# Patient Record
Sex: Female | Born: 1975 | Race: White | Hispanic: No | Marital: Married | Smoking: Former smoker
Health system: Southern US, Community
[De-identification: ages and names within clinical notes are randomized; demographics above are authoritative.]

## PROBLEM LIST (undated history)

## (undated) DIAGNOSIS — J45909 Unspecified asthma, uncomplicated: Secondary | ICD-10-CM

## (undated) DIAGNOSIS — Z8619 Personal history of other infectious and parasitic diseases: Secondary | ICD-10-CM

## (undated) DIAGNOSIS — R519 Headache, unspecified: Secondary | ICD-10-CM

## (undated) DIAGNOSIS — G43909 Migraine, unspecified, not intractable, without status migrainosus: Secondary | ICD-10-CM

## (undated) DIAGNOSIS — T7840XA Allergy, unspecified, initial encounter: Secondary | ICD-10-CM

## (undated) HISTORY — PX: NASAL SINUS SURGERY: SHX719

## (undated) HISTORY — PX: SKIN CANCER EXCISION: SHX779

## (undated) HISTORY — DX: Unspecified asthma, uncomplicated: J45.909

## (undated) HISTORY — DX: Personal history of other infectious and parasitic diseases: Z86.19

## (undated) HISTORY — DX: Headache, unspecified: R51.9

## (undated) HISTORY — DX: Migraine, unspecified, not intractable, without status migrainosus: G43.909

## (undated) HISTORY — DX: Allergy, unspecified, initial encounter: T78.40XA

---

## 2014-11-28 HISTORY — PX: TUBAL LIGATION: SHX77

## 2015-11-29 DIAGNOSIS — C4490 Unspecified malignant neoplasm of skin, unspecified: Secondary | ICD-10-CM

## 2015-11-29 HISTORY — DX: Unspecified malignant neoplasm of skin, unspecified: C44.90

## 2015-11-29 HISTORY — PX: ABLATION: SHX5711

## 2017-07-24 LAB — THYROID PEROXIDASE ANTIBODIES (TPO) (REFL): Anti-TPO Ab (RDL): <10

## 2018-01-22 LAB — TSH: TSH: 2.35 (ref <=5.90)

## 2018-01-22 LAB — T4, FREE: Free T4: 1.22

## 2018-01-22 LAB — T3, FREE: Free T3: 2.88

## 2018-08-17 LAB — HM MAMMOGRAPHY

## 2019-09-17 ENCOUNTER — Telehealth: Payer: Self-pay | Admitting: Obstetrics and Gynecology

## 2019-09-17 NOTE — Telephone Encounter (Signed)
Patient is schedule for 10/07/19

## 2019-09-17 NOTE — Telephone Encounter (Signed)
Called and spoke with patient to schedule new patient appointment. We have received records from Dr Lilyan Punt office. Patient wishes to call back to schedule annual appointment with Dr. Gilman Schmidt

## 2019-10-07 ENCOUNTER — Ambulatory Visit (INDEPENDENT_AMBULATORY_CARE_PROVIDER_SITE_OTHER): Payer: Managed Care, Other (non HMO) | Admitting: Obstetrics and Gynecology

## 2019-10-07 ENCOUNTER — Other Ambulatory Visit (HOSPITAL_COMMUNITY)
Admission: RE | Admit: 2019-10-07 | Discharge: 2019-10-07 | Disposition: A | Discharge status: Home / Self Care | Payer: Managed Care, Other (non HMO) | Source: Ambulatory Visit | Attending: Obstetrics and Gynecology | Admitting: Obstetrics and Gynecology

## 2019-10-07 ENCOUNTER — Other Ambulatory Visit: Payer: Self-pay

## 2019-10-07 ENCOUNTER — Encounter: Payer: Self-pay | Admitting: Obstetrics and Gynecology

## 2019-10-07 VITALS — BP 110/70 | HR 72 | Ht 67.0 in | Wt 183.0 lb

## 2019-10-07 DIAGNOSIS — Z13220 Encounter for screening for lipoid disorders: Secondary | ICD-10-CM

## 2019-10-07 DIAGNOSIS — E041 Nontoxic single thyroid nodule: Secondary | ICD-10-CM

## 2019-10-07 DIAGNOSIS — N946 Dysmenorrhea, unspecified: Secondary | ICD-10-CM

## 2019-10-07 DIAGNOSIS — Z0000 Encounter for general adult medical examination without abnormal findings: Secondary | ICD-10-CM

## 2019-10-07 DIAGNOSIS — Z130 Encounter for screening for diseases of the blood and blood-forming organs and certain disorders involving the immune mechanism: Secondary | ICD-10-CM

## 2019-10-07 DIAGNOSIS — Z124 Encounter for screening for malignant neoplasm of cervix: Secondary | ICD-10-CM

## 2019-10-07 DIAGNOSIS — Z9889 Other specified postprocedural states: Secondary | ICD-10-CM

## 2019-10-07 DIAGNOSIS — Z9851 Tubal ligation status: Secondary | ICD-10-CM

## 2019-10-07 DIAGNOSIS — Z01419 Encounter for gynecological examination (general) (routine) without abnormal findings: Secondary | ICD-10-CM | POA: Diagnosis not present

## 2019-10-07 DIAGNOSIS — Z1231 Encounter for screening mammogram for malignant neoplasm of breast: Secondary | ICD-10-CM

## 2019-10-07 DIAGNOSIS — Z131 Encounter for screening for diabetes mellitus: Secondary | ICD-10-CM

## 2019-10-07 DIAGNOSIS — Z30011 Encounter for initial prescription of contraceptive pills: Secondary | ICD-10-CM

## 2019-10-07 MED ORDER — NORETHIN ACE-ETH ESTRAD-FE 1-20 MG-MCG PO TABS: 1.0000 | ORAL_TABLET | Freq: Every day | ORAL | 11 refills | Status: DC

## 2019-10-07 MED ORDER — TRAMADOL HCL 50 MG PO TABS: 50.0000 mg | ORAL_TABLET | Freq: Four times a day (QID) | ORAL | 0 refills | Status: DC | PRN

## 2019-10-07 NOTE — Patient Instructions (Signed)
Institute of [place] for Calcium and Vitamin D  Age (yr) Calcium Recommended Dietary Allowance (mg/day) Vitamin D Recommended Dietary Allowance (international units/day)  9-18 1,300 600  19-50 1,000 600  51-70 1,200 600  71 and older 1,200 800  Data from Institute of Medicine. Dietary reference intakes: calcium, vitamin D. [place], [place]: [employer]; 2011.     Budget-Friendly Healthy Eating There are many ways to save money at the grocery store and continue to eat healthy. You can be successful if you:  Plan meals according to your budget.  Make a grocery list and only purchase food according to your grocery list.  Prepare food yourself. What are tips for following this plan?  Reading food labels  Compare food labels between brand name foods and the store brand. Often the nutritional value is the same, but the store brand is lower cost.  Look for products that do not have added sugar, fat, or salt (sodium). These often cost the same but are healthier for you. Products may be labeled as: ? Sugar-free. ? Nonfat. ? Low-fat. ? Sodium-free. ? Low-sodium.  Look for lean ground beef labeled as at least 92% lean and 8% fat. Shopping  Buy only the items on your grocery list and go only to the areas of the store that have the items on your list.  Use coupons only for foods and brands you normally buy. Avoid buying items you wouldn't normally buy simply because they are on sale.  Check online and in newspapers for weekly deals.  Buy healthy items from the bulk bins when available, such as herbs, spices, flour, pasta, nuts, and dried fruit.  Buy fruits and vegetables that are in season. Prices are usually lower on in-season produce.  Look at the unit price on the price tag. Use it to compare different brands and sizes to find out which item is the best deal.  Choose healthy items that are often low-cost, such as carrots,  potatoes, apples, bananas, and oranges. Dried or canned beans are a low-cost protein source.  Buy in bulk and freeze extra food. Items you can buy in bulk include meats, fish, poultry, frozen fruits, and frozen vegetables.  Avoid buying "ready-to-eat" foods, such as pre-cut fruits and vegetables and pre-made salads.  If possible, shop around to discover where you can find the best prices. Consider other retailers such as dollar stores, larger [employer], local fruit and vegetable stands, and farmers markets.  Do not shop when you are hungry. If you shop while hungry, it may be hard to stick to your list and budget.  Resist impulse buying. Use your grocery list as your official plan for the week.  Buy a variety of vegetables and fruits by purchasing fresh, frozen, and canned items.  Look at the top and bottom shelves for deals. Foods at eye level (eye level of an adult or child) are usually more expensive.  Be efficient with your time when shopping. The more time you spend at the store, the more money you are likely to spend.  To save money when choosing more expensive foods like meats and dairy: ? Choose cheaper cuts of meat, such as bone-in chicken thighs and drumsticks instead of skinless and boneless chicken. When you are ready to prepare the chicken, you can remove the skin yourself to make it healthier. ? Choose lean meats like chicken or [place] instead of beef. ? Choose canned seafood, such as tuna, salmon, or sardines. ? Buy  eggs as a low-cost source of protein. ? Buy dried beans and peas, such as lentils, split peas, or kidney beans instead of meats. Dried beans and peas are a good alternative source of protein. ? Buy the larger tubs of yogurt instead of individual-sized containers.  Choose water instead of sodas and other sweetened beverages.  Avoid buying chips, cookies, and other "junk food." These items are usually expensive and not healthy. Cooking  Make extra food  and freeze the extras in meal-sized containers or in individual portions for fast meals and snacks.  Pre-cook on days when you have extra time to prepare meals in advance. You can keep these meals in the fridge or freezer and reheat for a quick meal.  When you come home from the grocery store, wash, peel, and cut fruits and vegetables so they are ready to use and eat. This will help reduce food waste. Meal planning  Do not eat out or get fast food. Prepare food at home.  Make a grocery list and make sure to bring it with you to the store. If you have a smart phone, you could use your phone to create your shopping list.  Plan meals and snacks according to a grocery list and budget you create.  Use leftovers in your meal plan for the week.  Look for recipes where you can cook once and make enough food for two meals.  Include budget-friendly meals like stews, casseroles, and stir-fry dishes.  Try some meatless meals or try "no cook" meals like salads.  Make sure that half your plate is filled with fruits or vegetables. Choose from fresh, frozen, or canned fruits and vegetables. If eating canned, remember to rinse them before eating. This will remove any excess salt added for packaging. Summary  Eating healthy on a budget is possible if you plan your meals according to your budget, purchase according to your budget and grocery list, and prepare food yourself.  Tips for buying more food on a limited budget include buying generic brands, using coupons only for foods you normally buy, and buying healthy items from the bulk bins when available.  Tips for buying cheaper food to replace expensive food include choosing cheaper, lean cuts of meat, and buying dried beans and peas. This information is not intended to replace advice given to you by your health care provider. Make sure you discuss any questions you have with your health care provider. Document Released: 07/18/2014 Document Revised:  11/15/2017 Document Reviewed: 11/15/2017 Elsevier Patient Education  2020 [employer].  Exercising to Stay Healthy To become healthy and stay healthy, it is recommended that you do moderate-intensity and vigorous-intensity exercise. You can tell that you are exercising at a moderate intensity if your heart starts beating faster and you start breathing faster but can still hold a conversation. You can tell that you are exercising at a vigorous intensity if you are breathing much harder and faster and cannot hold a conversation while exercising. Exercising regularly is important. It has many health benefits, such as:  Improving overall fitness, flexibility, and endurance.  Increasing bone density.  Helping with weight control.  Decreasing body fat.  Increasing muscle strength.  Reducing stress and tension.  Improving overall health. How often should I exercise? Choose an activity that you enjoy, and set realistic goals. Your health care provider can help you make an activity plan that works for you. Exercise regularly as told by your health care provider. This may include:  Doing strength training  two times a week, such as: ? Lifting weights. ? Using resistance bands. ? Push-ups. ? Sit-ups. ? Yoga.  Doing a certain intensity of exercise for a given amount of time. Choose from these options: ? A total of 150 minutes of moderate-intensity exercise every week. ? A total of 75 minutes of vigorous-intensity exercise every week. ? A mix of moderate-intensity and vigorous-intensity exercise every week. Children, pregnant women, people who have not exercised regularly, people who are overweight, and older adults may need to talk with a health care provider about what activities are safe to do. If you have a medical condition, be sure to talk with your health care provider before you start a new exercise program. What are some exercise ideas? Moderate-intensity exercise ideas include:   Walking 1 mile (1.6 km) in about 15 minutes.  Biking.  Hiking.  Golfing.  Dancing.  Water aerobics. Vigorous-intensity exercise ideas include:  Walking 4.5 miles (7.2 km) or more in about 1 hour.  Jogging or running 5 miles (8 km) in about 1 hour.  Biking 10 miles (16.1 km) or more in about 1 hour.  Lap swimming.  Roller-skating or in-line skating.  Cross-country skiing.  Vigorous competitive sports, such as football, basketball, and soccer.  Jumping rope.  Aerobic dancing. What are some everyday activities that can help me to get exercise?  [place] work, such as: ? Pushing a Conservation officer, nature. ? Raking and bagging leaves.  Washing your car.  Pushing a stroller.  Shoveling snow.  Gardening.  Washing windows or floors. How can I be more active in my day-to-day activities?  Use stairs instead of an elevator.  Take a walk during your lunch break.  If you drive, park your car farther away from your work or school.  If you take public transportation, get off one stop early and walk the rest of the way.  Stand up or walk around during all of your indoor phone calls.  Get up, stretch, and walk around every 30 minutes throughout the day.  Enjoy exercise with a friend. Support to continue exercising will help you keep a regular routine of activity. What guidelines can I follow while exercising?  Before you start a new exercise program, talk with your health care provider.  Do not exercise so much that you hurt yourself, feel dizzy, or get very short of breath.  Wear comfortable clothes and wear shoes with good support.  Drink plenty of water while you exercise to prevent dehydration or heat stroke.  Work out until your breathing and your heartbeat get faster. Where to find more information  U.S. Department of Health and Human Services: [employer]at  Centers for Disease Control and Prevention (CDC): http://www.wolf.info/ Summary  Exercising regularly is important. It  will improve your overall fitness, flexibility, and endurance.  Regular exercise also will improve your overall health. It can help you control your weight, reduce stress, and improve your bone density.  Do not exercise so much that you hurt yourself, feel dizzy, or get very short of breath.  Before you start a new exercise program, talk with your health care provider. This information is not intended to replace advice given to you by your health care provider. Make sure you discuss any questions you have with your health care provider. Document Released: 12/17/2010 Document Revised: 10/27/2017 Document Reviewed: 10/05/2017 Elsevier Patient Education  2020 [employer].

## 2019-10-07 NOTE — Progress Notes (Signed)
Gynecology Annual Exam  PCP: Patient, No Pcp Per  Chief Complaint:  Chief Complaint  Patient presents with  . Gynecologic Exam    Severe cramping, wants to talk about partial hysterectomy     History of Present Illness: Patient is a 43 y.o. [id number] presents for annual exam. The patient has no complaints today.   LMP: No LMP recorded. Patient has had an ablation. Average Interval: regular, 28 days Duration of flow: 10 days Heavy Menses: no Clots: no Intermenstrual Bleeding: no Postcoital Bleeding: no Dysmenorrhea: yes  Small amount of bleeding but 2-3 days of severe pelvic pain which limits her activities. She take 4 advil every 3-4 hours. Pain usually happen 1-2 days before periods. Her monthly bleeding is much less but lasts for longer 9-10 days of dark brown spotting. Bleeding pattern is improved since the ablation, but is not gone.   She reports that she saw her prior OBGYN for this and was considering a partial hysterectomy.   The patient is sexually active. She currently uses tubal ligation for contraception. She denies dyspareunia.  The patient does perform self breast exams. There is no notable family history of breast or ovarian cancer in her family.  The patient wears seatbelts: yes.   The patient has regular exercise: not asked.    The patient denies current symptoms of depression.    Review of Systems: Review of Systems  Constitutional: Negative for chills, fever, malaise/fatigue and weight loss.  HENT: Negative for congestion, hearing loss and sinus pain.   Eyes: Negative for blurred vision and double vision.  Respiratory: Negative for cough, sputum production, shortness of breath and wheezing.   Cardiovascular: Negative for chest pain, palpitations, orthopnea and leg swelling.  Gastrointestinal: Negative for abdominal pain, constipation, diarrhea, nausea and vomiting.  Genitourinary: Negative for dysuria, flank pain, frequency, hematuria and urgency.   Musculoskeletal: Negative for back pain, falls and joint pain.  Skin: Negative for itching and rash.  Neurological: Negative for dizziness and headaches.  Psychiatric/Behavioral: Negative for depression, substance abuse and suicidal ideas. The patient is not nervous/anxious.    Past Medical History:  Past Medical History:  Diagnosis Date  . Skin cancer     Past Surgical History:  Past Surgical History:  Procedure Laterality Date  . ABLATION  2017  . CESAREAN SECTION  12/07/12,08/11/15  . NASAL SINUS SURGERY    . SKIN CANCER EXCISION     under nose  . TUBAL LIGATION  2016    Gynecologic History:  No LMP recorded. Patient has had an ablation. Contraception: tubal ligation Last Pap: Results were: 2016 NIL  Last mammogram: 2019 Results were: normal per patient  Obstetric History: G2P1103  Family History:  Family History  Problem Relation Age of Onset  . Colon cancer Maternal Uncle 45  . Colon cancer Maternal Uncle 41    Social History:  Social History   Socioeconomic History  . Marital status: Married    Spouse name: Not on file  . Number of children: Not on file  . Years of education: Not on file  . Highest education level: Not on file  Occupational History  . Not on file  Social Needs  . Financial resource strain: Not on file  . Food insecurity    Worry: Not on file    Inability: Not on file  . Transportation needs    Medical: Not on file    Non-medical: Not on file  Tobacco Use  . Smoking status: Never Smoker  .  Smokeless tobacco: Never Used  Substance and Sexual Activity  . Alcohol use: Yes    Comment: occasionally   . Drug use: Never  . Sexual activity: Yes    Birth control/protection: Surgical    Comment: Ablation   Lifestyle  . Physical activity    Days per week: Not on file    Minutes per session: Not on file  . Stress: Not on file  Relationships  . Social Herbalist on phone: Not on file    Gets together: Not on file    Attends  religious service: Not on file    Active member of club or organization: Not on file    Attends meetings of clubs or organizations: Not on file    Relationship status: Not on file  . Intimate partner violence    Fear of current or ex partner: Not on file    Emotionally abused: Not on file    Physically abused: Not on file    Forced sexual activity: Not on file  Other Topics Concern  . Not on file  Social History Narrative  . Not on file    Allergies:  No Known Allergies  Medications: Prior to Admission medications   Medication Sig Start Date End Date Taking? Authorizing Provider  ferrous sulfate 325 (65 FE) MG tablet Take by mouth.   Yes [provider]  levocetirizine (XYZAL) 5 MG tablet Take by mouth.   Yes [provider]  Multiple Vitamin (MULTI-VITAMIN) tablet Take by mouth.   Yes [provider]  phentermine (ADIPEX-P) 37.5 MG tablet TAKE 1 TABLET BY MOUTH EVERY MORNING PRIOR TO BREAKFAST 07/23/19  Yes [provider]  vitamin B-12 (CYANOCOBALAMIN) 100 MCG tablet Take 100 mcg by mouth daily.   Yes [provider]  vitamin E 400 UNIT capsule Take 400 Units by mouth daily.   Yes [provider]    Physical Exam Vitals: Blood pressure 110/70, pulse 72, height 5\' 7"  (1.702 m), weight 183 lb (83 kg).  General: NAD HEENT: normocephalic, anicteric Thyroid: no enlargement, no palpable nodules Pulmonary: No increased work of breathing, CTAB Cardiovascular: RRR, distal pulses 2+ Breast: Breast symmetrical, no tenderness, no palpable nodules or masses, no skin or nipple retraction present, no nipple discharge.  No axillary or supraclavicular lymphadenopathy. Abdomen: NABS, soft, non-tender, non-distended.  Umbilicus without lesions.  No hepatomegaly, splenomegaly or masses palpable. No evidence of hernia  Genitourinary:  External: Normal external female genitalia.  Normal urethral meatus, normal Bartholin's and Skene's glands.     Vagina: Normal vaginal mucosa, no evidence of prolapse.    Cervix: Grossly normal in appearance, no bleeding  Uterus: Non-enlarged, mobile, normal contour.  No CMT  Adnexa: ovaries non-enlarged, no adnexal masses  Rectal: deferred  Lymphatic: no evidence of inguinal lymphadenopathy Extremities: no edema, erythema, or tenderness Neurologic: Grossly intact Psychiatric: mood appropriate, affect full  Female chaperone present for pelvic and breast  portions of the physical exam    Assessment: 43 y.o. G2P1103 routine annual exam  Plan: Problem List Items Addressed This Visit    None    Visit Diagnoses    Healthcare maintenance    -  Primary   Dysmenorrhea       Relevant Medications   norethindrone-ethinyl estradiol (JUNEL FE 1/20) 1-20 MG-MCG tablet   traMADol (ULTRAM) 50 MG tablet   Hx of prior ablation treatment       Hx of tubal ligation       Encounter  for initial prescription of contraceptive pills       Cervical cancer screening       Relevant Orders   Cytology - PAP   Breast cancer screening by mammogram       Relevant Orders   MM 3D SCREEN BREAST BILATERAL   Thyroid nodule       Relevant Orders   TSH   T4, free   Screening cholesterol level       Relevant Orders   Lipid panel   Screening for diabetes mellitus       Relevant Orders   Comprehensive metabolic panel   Screening for deficiency anemia       Relevant Orders   CBC With Differential      1) Mammogram - recommend yearly screening mammogram.  Mammogram Was ordered today  2) STI screening  wasoffered and declined  3) ASCCP guidelines and rational discussed.  Patient opts for every 5 years screening interval  4) Contraception - the patient is currently using  tubal ligation.  She is interested in starting Contraception: extended cycle junel for cyclical pelvic pain  5) Colonoscopy -- Screening recommended starting at age 77 for average risk individuals, age 73 for individuals deemed at increased  risk (including African Americans) and recommended to continue until age 51.  For patient age 40-85 individualized approach is recommended.  Gold standard screening is via colonoscopy, Cologuard screening is an acceptable alternative for patient unwilling or unable to undergo colonoscopy.  "Colorectal cancer screening for average?risk adults: 2018 guideline update from the American Cancer Society"CA: A Cancer Journal for Clinicians: Apr 26, 2017   6) Routine healthcare maintenance including cholesterol, diabetes screening discussed To return fasting at a later date  7) Discussed options of management of dysmenorrhea. Options for IUD, OCP, NSAIDS, expectant management and hysterectomy discussed. She would like to trial 3 months of continuous birth control and then see how she feels. She is currently taking above the recommended amounts of NSAIDS, encouraged her to limit to 800mg  every 8 hours. Will send tramadol which she can also use to limit NSAID use.   8) Return in about 3 months (around 01/07/2020) for GYN visit, lasb asap.  Adrian Prows MD [facility] 10/07/2019 9:58 AM

## 2019-10-09 LAB — CYTOLOGY - PAP
Comment: NEGATIVE
Diagnosis: NEGATIVE
High risk HPV: NEGATIVE

## 2019-10-11 NOTE — Progress Notes (Signed)
Please call patient with normal pap result.

## 2019-10-11 NOTE — Progress Notes (Signed)
Pt aware.

## 2019-10-18 ENCOUNTER — Other Ambulatory Visit: Payer: Self-pay

## 2019-10-18 ENCOUNTER — Other Ambulatory Visit: Payer: Managed Care, Other (non HMO)

## 2019-10-18 DIAGNOSIS — Z130 Encounter for screening for diseases of the blood and blood-forming organs and certain disorders involving the immune mechanism: Secondary | ICD-10-CM

## 2019-10-18 DIAGNOSIS — E041 Nontoxic single thyroid nodule: Secondary | ICD-10-CM

## 2019-10-18 DIAGNOSIS — Z13220 Encounter for screening for lipoid disorders: Secondary | ICD-10-CM

## 2019-10-18 DIAGNOSIS — Z131 Encounter for screening for diabetes mellitus: Secondary | ICD-10-CM

## 2019-10-19 LAB — COMPREHENSIVE METABOLIC PANEL
ALT: 8 IU/L (ref 0–32)
AST: 13 IU/L (ref 0–40)
Albumin/Globulin Ratio: 2.3 — ABNORMAL HIGH (ref 1.2–2.2)
Albumin: 4.5 g/dL (ref 3.8–4.8)
Alkaline Phosphatase: 46 IU/L (ref 39–117)
BUN/Creatinine Ratio: 14 (ref 9–23)
BUN: 12 mg/dL (ref 6–24)
Bilirubin Total: 0.5 mg/dL (ref 0.0–1.2)
CO2: 24 mmol/L (ref 20–29)
Calcium: 9.4 mg/dL (ref 8.7–10.2)
Chloride: 105 mmol/L (ref 96–106)
Creatinine, Ser: 0.88 mg/dL (ref 0.57–1.00)
GFR calc Af Amer: 93 mL/min/{1.73_m2} (ref >59)
GFR calc non Af Amer: 81 mL/min/{1.73_m2} (ref >59)
Globulin, Total: 2 g/dL (ref 1.5–4.5)
Glucose: 89 mg/dL (ref 65–99)
Potassium: 4.6 mmol/L (ref 3.5–5.2)
Sodium: 141 mmol/L (ref 134–144)
Total Protein: 6.5 g/dL (ref 6.0–8.5)

## 2019-10-19 LAB — CBC WITH DIFFERENTIAL
Basophils Absolute: 0.1 10*3/uL (ref 0.0–0.2)
Basos: 1 %
EOS (ABSOLUTE): 0.1 10*3/uL (ref 0.0–0.4)
Eos: 2 %
Hematocrit: 41.8 % (ref 34.0–46.6)
Hemoglobin: 13.9 g/dL (ref 11.1–15.9)
Immature Grans (Abs): 0 10*3/uL (ref 0.0–0.1)
Immature Granulocytes: 0 %
Lymphocytes Absolute: 2.5 10*3/uL (ref 0.7–3.1)
Lymphs: 36 %
MCH: 31.6 pg (ref 26.6–33.0)
MCHC: 33.3 g/dL (ref 31.5–35.7)
MCV: 95 fL (ref 79–97)
Monocytes Absolute: 0.5 10*3/uL (ref 0.1–0.9)
Monocytes: 8 %
Neutrophils Absolute: 3.6 10*3/uL (ref 1.4–7.0)
Neutrophils: 53 %
RBC: 4.4 x10E6/uL (ref 3.77–5.28)
RDW: 12 % (ref 11.7–15.4)
WBC: 6.9 10*3/uL (ref 3.4–10.8)

## 2019-10-19 LAB — LIPID PANEL
Chol/HDL Ratio: 3.8 ratio (ref 0.0–4.4)
Cholesterol, Total: 178 mg/dL (ref 100–199)
HDL: 47 mg/dL (ref >39)
LDL Chol Calc (NIH): 116 mg/dL — ABNORMAL HIGH (ref 0–99)
Triglycerides: 82 mg/dL (ref 0–149)
VLDL Cholesterol Cal: 15 mg/dL (ref 5–40)

## 2019-10-19 LAB — T4, FREE: Free T4: 1.28 ng/dL (ref 0.82–1.77)

## 2019-10-19 LAB — TSH: TSH: 2.67 u[IU]/mL (ref 0.450–4.500)

## 2019-10-21 ENCOUNTER — Other Ambulatory Visit: Payer: Self-pay

## 2019-10-21 ENCOUNTER — Encounter: Payer: Self-pay | Admitting: Family Medicine

## 2019-10-21 ENCOUNTER — Ambulatory Visit: Payer: Managed Care, Other (non HMO) | Admitting: Family Medicine

## 2019-10-21 VITALS — BP 118/82 | HR 77 | Temp 98.0°F | Resp 16 | Ht 67.0 in | Wt 184.1 lb

## 2019-10-21 DIAGNOSIS — G43829 Menstrual migraine, not intractable, without status migrainosus: Secondary | ICD-10-CM | POA: Diagnosis not present

## 2019-10-21 DIAGNOSIS — E041 Nontoxic single thyroid nodule: Secondary | ICD-10-CM

## 2019-10-21 DIAGNOSIS — E663 Overweight: Secondary | ICD-10-CM | POA: Diagnosis not present

## 2019-10-21 DIAGNOSIS — Z8589 Personal history of malignant neoplasm of other organs and systems: Secondary | ICD-10-CM | POA: Insufficient documentation

## 2019-10-21 DIAGNOSIS — G43909 Migraine, unspecified, not intractable, without status migrainosus: Secondary | ICD-10-CM | POA: Insufficient documentation

## 2019-10-21 NOTE — Assessment & Plan Note (Signed)
Will obtain records. Anticipate [place] in 4 years

## 2019-10-21 NOTE — Assessment & Plan Note (Signed)
Working on weight loss and OB/GYN managing medication

## 2019-10-21 NOTE — Assessment & Plan Note (Signed)
Follows with dermatology 

## 2019-10-21 NOTE — Patient Instructions (Signed)

## 2019-10-21 NOTE — Progress Notes (Addendum)
Subjective:     Natalie Robinson is a 43 y.o. female presenting for Establish Care     HPI  #thyroid nodule - stable on last imaging - now every 5 years recommended  Dentist - yes Vision - yes  Overweight - taking phentermine - goal weight 150-160 - tries to do low carb and meal replacement - exercising 3x week at [employer]   #severe menstrual cramps - starting birth control - has tramadol for the cramps - regular cycles - follows with OB/GYN  Review of Systems  Constitutional: Negative.   HENT: Negative.   Eyes: Negative.   Respiratory: Negative.   Cardiovascular: Negative.   Gastrointestinal: Negative.   Endocrine: Negative.   Genitourinary: Negative.   Musculoskeletal: Negative.   Skin: Negative.   Allergic/Immunologic: Negative.   Neurological: Negative.   Hematological: Negative.   Psychiatric/Behavioral: Negative.      Social History   Tobacco Use  Smoking Status Former Smoker  . Packs/day: 0.10  . Years: 10.00  . Pack years: 1.00  . Types: Cigarettes  . Quit date: 11/29/2003  . Years since quitting: 15.9  Smokeless Tobacco Never Used   I have reviewed the patients PMH, PSH, FmHx, Social Hx, medications and allergies and they are updated in Epic.       Objective:    BP Readings from Last 3 Encounters:  10/21/19 118/82  10/07/19 110/70   Wt Readings from Last 3 Encounters:  10/21/19 184 lb 1.6 oz (83.5 kg)  10/07/19 183 lb (83 kg)    BP 118/82   Pulse 77   Temp 98 F (36.7 C)   Resp 16   Ht 5\' 7"  (1.702 m)   Wt 184 lb 1.6 oz (83.5 kg)   SpO2 97%   BMI 28.83 kg/m    Physical Exam Constitutional:      General: She is not in acute distress.    Appearance: She is well-developed. She is not diaphoretic.  HENT:     Right Ear: External ear normal.     Left Ear: External ear normal.     Nose: Nose normal.  Eyes:     Conjunctiva/sclera: Conjunctivae normal.  Neck:     Musculoskeletal: Neck supple.  Cardiovascular:      Rate and Rhythm: Normal rate and regular rhythm.     Heart sounds: No murmur.  Pulmonary:     Effort: Pulmonary effort is normal.     Breath sounds: Normal breath sounds.  Skin:    General: Skin is warm and dry.     Capillary Refill: Capillary refill takes less than 2 seconds.  Neurological:     Mental Status: She is alert. Mental status is at baseline.  Psychiatric:        Mood and Affect: Mood normal.        Behavior: Behavior normal.      The 10-year ASCVD risk score Mikey Bussing DC Jr., et al., 2013) is: 0.6%   Values used to calculate the score:     Age: 16 years     Sex: Female     Is Non-Hispanic African American: No     Diabetic: No     Tobacco smoker: No     Systolic Blood Pressure: 123456 mmHg     Is BP treated: No     HDL Cholesterol: 47 mg/dL     Total Cholesterol: 178 mg/dL      Assessment & Plan:   Problem List Items Addressed This Visit  Cardiovascular and Mediastinum   Migraine    Stable. Will continue to monitor        Endocrine   Thyroid nodule - Primary    Will obtain records. Anticipate [place] in 4 years        Other   History of squamous cell carcinoma    Follows with dermatology      Overweight (BMI 25.0-29.9)    Working on weight loss and OB/GYN managing medication        This visit occurred during the SARS-CoV-2 public health emergency.  Safety protocols were in place, including screening questions prior to the visit, additional usage of staff PPE, and extensive cleaning of exam room while observing appropriate contact time as indicated for disinfecting solutions.     Return in about 1 year (around 10/20/2020).  Lesleigh Noe, MD

## 2019-10-21 NOTE — Assessment & Plan Note (Signed)
Stable. Will continue to monitor.

## 2020-01-06 ENCOUNTER — Ambulatory Visit: Payer: Managed Care, Other (non HMO) | Admitting: Obstetrics and Gynecology

## 2020-01-08 ENCOUNTER — Ambulatory Visit (INDEPENDENT_AMBULATORY_CARE_PROVIDER_SITE_OTHER): Payer: Managed Care, Other (non HMO) | Admitting: Obstetrics and Gynecology

## 2020-01-08 ENCOUNTER — Encounter: Payer: Self-pay | Admitting: Obstetrics and Gynecology

## 2020-01-08 ENCOUNTER — Other Ambulatory Visit: Payer: Self-pay

## 2020-01-15 ENCOUNTER — Ambulatory Visit
Admission: RE | Admit: 2020-01-15 | Discharge: 2020-01-15 | Disposition: A | Discharge status: Home / Self Care | Payer: Managed Care, Other (non HMO) | Source: Ambulatory Visit | Attending: Obstetrics and Gynecology | Admitting: Obstetrics and Gynecology

## 2020-01-15 DIAGNOSIS — Z1231 Encounter for screening mammogram for malignant neoplasm of breast: Secondary | ICD-10-CM | POA: Insufficient documentation

## 2020-01-28 NOTE — Progress Notes (Signed)
Please call with normal mammogram

## 2020-01-29 ENCOUNTER — Encounter: Payer: Self-pay | Admitting: Obstetrics and Gynecology

## 2020-01-29 ENCOUNTER — Ambulatory Visit (INDEPENDENT_AMBULATORY_CARE_PROVIDER_SITE_OTHER): Payer: Managed Care, Other (non HMO) | Admitting: Obstetrics and Gynecology

## 2020-01-29 ENCOUNTER — Other Ambulatory Visit: Payer: Self-pay

## 2020-01-29 VITALS — BP 110/60 | Temp 97.0°F | Ht 67.0 in | Wt 186.0 lb

## 2020-01-29 DIAGNOSIS — N9489 Other specified conditions associated with female genital organs and menstrual cycle: Secondary | ICD-10-CM

## 2020-01-29 DIAGNOSIS — N946 Dysmenorrhea, unspecified: Secondary | ICD-10-CM | POA: Diagnosis not present

## 2020-01-29 DIAGNOSIS — Z9889 Other specified postprocedural states: Secondary | ICD-10-CM

## 2020-01-29 NOTE — Progress Notes (Signed)
Patient ID: Natalie Robinson, female   DOB: 09-Jul-1976, 44 y.o.   MRN: [id number]  Reason for Consult: Follow-up (Plan for what to do with the cramping, havent had much cramping since going off the pill)   Referred by Lesleigh Noe, MD  Subjective:     HPI:  Natalie Robinson is a 44 y.o. female she is following up today after her previous visit in November 2020.  At that time she had been having difficulty with spotting and pain after an endometrial ablation.  She took 3 months of Junel continuous.  She has been off of it for about 4 weeks.  She reports that she did not like how it affected her mood but it did improve her bleeding and cramping.  While taking the pill and since being off of the pill she has not had any uterine bleeding or pain.  She is happy with this result.  She is no longer frequently taking ibuprofen.  She took 1 or 2 tablets of the tramadol before and still has some pills if needed.  She reports that it helped a lot with her pain.    Past Medical History:  Diagnosis Date  . Allergy   . Asthma   . Frequent headaches   . History of chicken pox   . Migraines   . Skin cancer 2017   Family History  Problem Relation Age of Onset  . Colon cancer Maternal Uncle 71  . Colon cancer Maternal Uncle 34  . Hyperlipidemia Mother   . Hyperlipidemia Father   . Arthritis Father   . Cancer Father        skin caner  . Stroke Maternal Grandmother   . Hyperlipidemia Maternal Grandfather   . Stroke Paternal Grandmother   . Mental illness Sister   . Breast cancer Neg Hx    Past Surgical History:  Procedure Laterality Date  . ABLATION  2017  . CESAREAN SECTION  12/07/12,08/11/15  . NASAL SINUS SURGERY    . SKIN CANCER EXCISION     under nose  . TUBAL LIGATION  2016    Short Social History:  Social History   Tobacco Use  . Smoking status: Former Smoker    Packs/day: 0.10    Years: 10.00    Pack years: 1.00    Types: Cigarettes    Quit date: 11/29/2003   Years since quitting: 16.1  . Smokeless tobacco: Never Used  Substance Use Topics  . Alcohol use: Yes    Comment: 1x per week, 1 serving    No Known Allergies  Current Outpatient Medications  Medication Sig Dispense Refill  . BIOTIN PO Take by mouth.    . ferrous sulfate 325 (65 FE) MG tablet Take by mouth.    Marland Kitchen ketoconazole (NIZORAL) 2 % shampoo Apply 1 application topically 2 (two) times a week.    . levocetirizine (XYZAL) 5 MG tablet Take by mouth.    . Multiple Vitamin (MULTI-VITAMIN) tablet Take by mouth.    . traMADol (ULTRAM) 50 MG tablet Take 1 tablet (50 mg total) by mouth every 6 (six) hours as needed for severe pain. 60 tablet 0  . vitamin B-12 (CYANOCOBALAMIN) 100 MCG tablet Take 100 mcg by mouth daily.    Marland Kitchen VITAMIN D PO Take by mouth.    . norethindrone-ethinyl estradiol (JUNEL FE 1/20) 1-20 MG-MCG tablet Take 1 tablet by mouth daily. (Patient not taking: Reported on 10/21/2019) 1 Package 11   No current facility-administered medications for  this visit.    Review of Systems  Constitutional: Negative for chills, fatigue, fever and unexpected weight change.  HENT: Negative for trouble swallowing.  Eyes: Negative for loss of vision.  Respiratory: Negative for cough, shortness of breath and wheezing.  Cardiovascular: Negative for chest pain, leg swelling, palpitations and syncope.  GI: Negative for abdominal pain, blood in stool, diarrhea, nausea and vomiting.  GU: Negative for difficulty urinating, dysuria, frequency and hematuria.  Musculoskeletal: Negative for back pain, leg pain and joint pain.  Skin: Negative for rash.  Neurological: Negative for dizziness, headaches, light-headedness, numbness and seizures.  Psychiatric: Negative for behavioral problem, confusion, depressed mood and sleep disturbance.        Objective:  Objective   Vitals:   01/29/20 0808  BP: 110/60  Temp: (!) 97 F (36.1 C)  Weight: 186 lb (84.4 kg)  Height: 5\' 7"  (1.702 m)   Body  mass index is 29.13 kg/m.  Physical Exam Vitals and nursing note reviewed.  Constitutional:      Appearance: She is well-developed.  HENT:     Head: Normocephalic and atraumatic.  Eyes:     Pupils: Pupils are equal, round, and reactive to light.  Cardiovascular:     Rate and Rhythm: Normal rate and regular rhythm.  Pulmonary:     Effort: Pulmonary effort is normal. No respiratory distress.  Skin:    General: Skin is warm and dry.  Neurological:     Mental Status: She is alert and oriented to person, place, and time.  Psychiatric:        Behavior: Behavior normal.        Thought Content: Thought content normal.        Judgment: Judgment normal.        Assessment/Plan:     44 year old with pain irregular bleeding status post ablation. Symptoms have improved after continuous birth control pills.  Patient is happy with this outcome and will monitor for any return of symptoms.  If necessary she will call for a prescription refill.  She would like to consider a oral contraceptive pill if needed with lower estrogen or different progesterone.   More than 10 minutes were spent face to face with the patient in the room with more than 50% of the time spent providing counseling and discussing the plan of management.     Adrian Prows MD [facility], [facility] 01/29/2020 8:29 AM

## 2020-02-23 ENCOUNTER — Ambulatory Visit: Payer: Managed Care, Other (non HMO) | Attending: Internal Medicine

## 2020-02-23 DIAGNOSIS — Z23 Encounter for immunization: Secondary | ICD-10-CM

## 2020-02-23 NOTE — Progress Notes (Signed)
   Covid-19 Vaccination Clinic  Name:  Natalie Robinson    MRN: [id number] DOB: 1975/12/12  02/23/2020  Ms. Natalie Robinson was observed post Covid-19 immunization for 15 minutes without incident. She was provided with Vaccine Information Sheet and instruction to access the V-Safe system.   Ms. Natalie Robinson was instructed to call 911 with any severe reactions post vaccine: Marland Kitchen Difficulty breathing  . Swelling of face and throat  . A fast heartbeat  . A bad rash all over body  . Dizziness and weakness   Immunizations Administered    Name Date Dose VIS Date Route   Pfizer COVID-19 Vaccine 02/23/2020 12:20 PM 0.3 mL 11/08/2019 Intramuscular   Manufacturer: [place]   Lot: [id number]   [place]: [id number]

## 2020-03-15 ENCOUNTER — Ambulatory Visit: Payer: Managed Care, Other (non HMO) | Attending: Internal Medicine

## 2020-03-15 DIAGNOSIS — Z23 Encounter for immunization: Secondary | ICD-10-CM

## 2020-03-15 NOTE — Progress Notes (Signed)
   Covid-19 Vaccination Clinic  Name:  Natalie Robinson    MRN: [id number] DOB: Mar 27, 1976  03/15/2020  [name] was observed post Covid-19 immunization for 15 minutes without incident. She was provided with Vaccine Information Sheet and instruction to access the V-Safe system.   [name] was instructed to call 911 with any severe reactions post vaccine: Marland Kitchen Difficulty breathing  . Swelling of face and throat  . A fast heartbeat  . A bad rash all over body  . Dizziness and weakness   Immunizations Administered    Name Date Dose VIS Date Route   Pfizer COVID-19 Vaccine 03/15/2020 12:06 PM 0.3 mL 11/08/2019 Intramuscular   Manufacturer: [place]   Lot: [id number]   [place]: [id number]

## 2020-03-26 ENCOUNTER — Ambulatory Visit: Payer: Managed Care, Other (non HMO) | Admitting: Family Medicine

## 2020-03-26 ENCOUNTER — Other Ambulatory Visit: Payer: Self-pay

## 2020-03-26 ENCOUNTER — Encounter: Payer: Self-pay | Admitting: Family Medicine

## 2020-03-26 VITALS — BP 106/78 | HR 92 | Temp 97.8°F | Resp 16 | Ht 67.0 in | Wt 191.0 lb

## 2020-03-26 DIAGNOSIS — R300 Dysuria: Secondary | ICD-10-CM

## 2020-03-26 LAB — POCT URINALYSIS DIPSTICK
Bilirubin, UA: NEGATIVE
Blood, UA: POSITIVE
Glucose, UA: NEGATIVE
Ketones, UA: NEGATIVE
Leukocytes, UA: NEGATIVE
Nitrite, UA: NEGATIVE
Protein, UA: NEGATIVE
Spec Grav, UA: 1.025 (ref 1.010–1.025)
Urobilinogen, UA: 0.2 E.U./dL
pH, UA: 5.5 (ref 5.0–8.0)

## 2020-03-26 MED ORDER — NITROFURANTOIN MONOHYD MACRO 100 MG PO CAPS: 100.0000 mg | ORAL_CAPSULE | Freq: Two times a day (BID) | ORAL | 0 refills | Status: AC

## 2020-03-26 NOTE — Patient Instructions (Signed)
Drink lots of water Can start antibiotics whenever you would like Urine culture should be back by Saturday

## 2020-03-26 NOTE — Progress Notes (Signed)
   Subjective:     Shayma Schwiesow is a 44 y.o. female presenting for Urinary Frequency (started on 03/25/20, urgency, burning with urination, pressure.)     HPI   #Urinary frequency - started yesterday afternoon - frequency - burning at the end - a lot of pressure - incomplete emptying - would try and not be able to pee - hesitancy - no abdominal pain - no n/v, f/c - did have sex recently  - but did not use the bathroom the last few times - no vaginal discharge -    Review of Systems   Social History   Tobacco Use  Smoking Status Former Smoker  . Packs/day: 0.10  . Years: 10.00  . Pack years: 1.00  . Types: Cigarettes  . Quit date: 11/29/2003  . Years since quitting: 16.3  Smokeless Tobacco Never Used        Objective:    BP Readings from Last 3 Encounters:  03/26/20 106/78  01/29/20 110/60  01/08/20 110/60   Wt Readings from Last 3 Encounters:  03/26/20 191 lb (86.6 kg)  01/29/20 186 lb (84.4 kg)  01/08/20 183 lb (83 kg)    BP 106/78   Pulse 92   Temp 97.8 F (36.6 C)   Resp 16   Ht 5\' 7"  (1.702 m)   Wt 191 lb (86.6 kg)   BMI 29.91 kg/m    Physical Exam Constitutional:      General: She is not in acute distress.    Appearance: She is well-developed. She is not diaphoretic.  HENT:     Head: Normocephalic and atraumatic.  Eyes:     Conjunctiva/sclera: Conjunctivae normal.  Cardiovascular:     Rate and Rhythm: Normal rate.  Pulmonary:     Effort: Pulmonary effort is normal.     Breath sounds: Normal breath sounds.  Abdominal:     General: Bowel sounds are normal. There is no distension.     Palpations: Abdomen is soft.     Tenderness: There is no abdominal tenderness. There is no right CVA tenderness, left CVA tenderness or guarding.  Musculoskeletal:     Cervical back: Neck supple.  Skin:    General: Skin is warm and dry.  Neurological:     Mental Status: She is alert.           Assessment & Plan:   Problem List Items  Addressed This Visit    None    Visit Diagnoses    Dysuria    -  Primary   Relevant Medications   nitrofurantoin, macrocrystal-monohydrate, (MACROBID) 100 MG capsule   Other Relevant Orders   POCT urinalysis dipstick (Completed)   Urine Culture     Hx consistent with UTI even though dip with only RBC. Discussed watch and wait, if symptoms resolve with another day of fluid/cranberry juice then no antibiotics. If still having symptoms tomorrow start Abx. Urine culture to verify.   Return if symptoms worsen or fail to improve.  Lesleigh Noe, MD

## 2020-03-28 LAB — URINE CULTURE
MICRO NUMBER:: 10421036
SPECIMEN QUALITY:: ADEQUATE

## 2020-10-07 ENCOUNTER — Encounter: Payer: Self-pay | Admitting: Obstetrics and Gynecology

## 2020-10-07 ENCOUNTER — Other Ambulatory Visit: Payer: Self-pay

## 2020-10-07 ENCOUNTER — Ambulatory Visit (INDEPENDENT_AMBULATORY_CARE_PROVIDER_SITE_OTHER): Payer: 59 | Admitting: Obstetrics and Gynecology

## 2020-10-07 VITALS — BP 128/74 | Ht 67.0 in | Wt 203.6 lb

## 2020-10-07 DIAGNOSIS — Z1329 Encounter for screening for other suspected endocrine disorder: Secondary | ICD-10-CM

## 2020-10-07 DIAGNOSIS — Z13220 Encounter for screening for lipoid disorders: Secondary | ICD-10-CM

## 2020-10-07 DIAGNOSIS — Z23 Encounter for immunization: Secondary | ICD-10-CM

## 2020-10-07 DIAGNOSIS — N946 Dysmenorrhea, unspecified: Secondary | ICD-10-CM

## 2020-10-07 DIAGNOSIS — Z131 Encounter for screening for diabetes mellitus: Secondary | ICD-10-CM

## 2020-10-07 DIAGNOSIS — Z01419 Encounter for gynecological examination (general) (routine) without abnormal findings: Secondary | ICD-10-CM

## 2020-10-07 DIAGNOSIS — Z0000 Encounter for general adult medical examination without abnormal findings: Secondary | ICD-10-CM

## 2020-10-07 DIAGNOSIS — Z130 Encounter for screening for diseases of the blood and blood-forming organs and certain disorders involving the immune mechanism: Secondary | ICD-10-CM

## 2020-10-07 DIAGNOSIS — Z1231 Encounter for screening mammogram for malignant neoplasm of breast: Secondary | ICD-10-CM | POA: Diagnosis not present

## 2020-10-07 DIAGNOSIS — Z1211 Encounter for screening for malignant neoplasm of colon: Secondary | ICD-10-CM

## 2020-10-07 MED ORDER — TRAMADOL HCL 50 MG PO TABS: 50.0000 mg | ORAL_TABLET | Freq: Four times a day (QID) | ORAL | 0 refills | Status: DC | PRN

## 2020-10-07 NOTE — Progress Notes (Signed)
Pt is here for her Annual Exam.

## 2020-10-07 NOTE — Patient Instructions (Addendum)
Institute [employer] Recommended Dietary Allowances for Calcium and Vitamin D  Age (yr) Calcium Recommended Dietary Allowance (mg/day) Vitamin D Recommended Dietary Allowance (international units/day)  9-18 1,300 600  19-50 1,000 600  51-70 1,200 600  71 and older 1,200 800  Data from [employer]. Dietary reference intakes: calcium, vitamin D. [place], [place]: [employer]ies [employer]; 2011.     Exercising to Stay Healthy To become healthy and stay healthy, it is recommended that you do moderate-intensity and vigorous-intensity exercise. You can tell that you are exercising at a moderate intensity if your heart starts beating faster and you start breathing faster but can still hold a conversation. You can tell that you are exercising at a vigorous intensity if you are breathing much harder and faster and cannot hold a conversation while exercising. Exercising regularly is important. It has many health benefits, such as:  Improving overall fitness, flexibility, and endurance.  Increasing bone density.  Helping with weight control.  Decreasing body fat.  Increasing muscle strength.  Reducing stress and tension.  Improving overall health. How often should I exercise? Choose an activity that you enjoy, and set realistic goals. Your health care provider can help you make an activity plan that works for you. Exercise regularly as told by your health care provider. This may include:  Doing strength training two times a week, such as: ? Lifting weights. ? Using resistance bands. ? Push-ups. ? Sit-ups. ? Yoga.  Doing a certain intensity of exercise for a given amount of time. Choose from these options: ? A total of 150 minutes of moderate-intensity exercise every week. ? A total of 75 minutes of vigorous-intensity exercise every week. ? A mix of moderate-intensity and vigorous-intensity exercise every week. Children, pregnant women, people who have not exercised  regularly, people who are overweight, and older adults may need to talk with a health care provider about what activities are safe to do. If you have a medical condition, be sure to talk with your health care provider before you start a new exercise program. What are some exercise ideas? Moderate-intensity exercise ideas include:  Walking 1 mile (1.6 km) in about 15 minutes.  Biking.  Hiking.  Golfing.  Dancing.  Water aerobics. Vigorous-intensity exercise ideas include:  Walking 4.5 miles (7.2 km) or more in about 1 hour.  Jogging or running 5 miles (8 km) in about 1 hour.  Biking 10 miles (16.1 km) or more in about 1 hour.  Lap swimming.  Roller-skating or in-line skating.  Cross-country skiing.  Vigorous competitive sports, such as football, basketball, and soccer.  Jumping rope.  Aerobic dancing. What are some everyday activities that can help me to get exercise?  Yard work, such as: ? Pushing a lawn mower. ? Raking and bagging leaves.  Washing your car.  Pushing a stroller.  Shoveling snow.  Gardening.  Washing windows or floors. How can I be more active in my day-to-day activities?  Use stairs instead of an elevator.  Take a walk during your lunch break.  If you drive, park your car farther away from your work or school.  If you take public transportation, get off one stop early and walk the rest of the way.  Stand up or walk around during all of your indoor phone calls.  Get up, stretch, and walk around every 30 minutes throughout the day.  Enjoy exercise with a friend. Support to continue exercising will help you keep a regular routine of activity. What guidelines can   I follow while exercising?  Before you start a new exercise program, talk with your health care provider.  Do not exercise so much that you hurt yourself, feel dizzy, or get very short of breath.  Wear comfortable clothes and wear shoes with good support.  Drink plenty of  water while you exercise to prevent dehydration or heat stroke.  Work out until your breathing and your heartbeat get faster. Where to find more information  [place] Department of Health and Human Services: www.hhs.gov  Centers for Disease Control and Prevention (CDC): www.cdc.gov Summary  Exercising regularly is important. It will improve your overall fitness, flexibility, and endurance.  Regular exercise also will improve your overall health. It can help you control your weight, reduce stress, and improve your bone density.  Do not exercise so much that you hurt yourself, feel dizzy, or get very short of breath.  Before you start a new exercise program, talk with your health care provider. This information is not intended to replace advice given to you by your health care provider. Make sure you discuss any questions you have with your health care provider. Document Revised: 10/27/2017 Document Reviewed: 10/05/2017 Elsevier Patient Education  2020 [employer]   Budget-Friendly Healthy Eating There are many ways to save money at the grocery store and continue to eat healthy. You can be successful if you:  Plan meals according to your budget.  Make a grocery list and only purchase food according to your grocery list.  Prepare food yourself. What are tips for following this plan?  Reading food labels  Compare food labels between brand name foods and the store brand. Often the nutritional value is the same, but the store brand is lower cost.  Look for products that do not have added sugar, fat, or salt (sodium). These often cost the same but are healthier for you. Products may be labeled as: ? Sugar-free. ? Nonfat. ? Low-fat. ? Sodium-free. ? Low-sodium.  Look for lean ground beef labeled as at least 92% lean and 8% fat. Shopping  Buy only the items on your grocery list and go only to the areas of the store that have the items on your list.  Use coupons only for foods  and brands you normally buy. Avoid buying items you wouldn't normally buy simply because they are on sale.  Check online and in newspapers for weekly deals.  Buy healthy items from the bulk bins when available, such as herbs, spices, flour, pasta, nuts, and dried fruit.  Buy fruits and vegetables that are in season. Prices are usually lower on in-season produce.  Look at the unit price on the price tag. Use it to compare different brands and sizes to find out which item is the best deal.  Choose healthy items that are often low-cost, such as carrots, potatoes, apples, bananas, and oranges. Dried or canned beans are a low-cost protein source.  Buy in bulk and freeze extra food. Items you can buy in bulk include meats, fish, poultry, frozen fruits, and frozen vegetables.  Avoid buying "ready-to-eat" foods, such as pre-cut fruits and vegetables and pre-made salads.  If possible, shop around to discover where you can find the best prices. Consider other retailers such as dollar stores, larger wholesale stores, local fruit and vegetable stands, and farmers markets.  Do not shop when you are hungry. If you shop while hungry, it may be hard to stick to your list and budget.  Resist impulse buying. Use your grocery list as   your official plan for the week.  Buy a variety of vegetables and fruits by purchasing fresh, frozen, and canned items.  Look at the top and bottom shelves for deals. Foods at eye level (eye level of an adult or child) are usually more expensive.  Be efficient with your time when shopping. The more time you spend at the store, the more money you are likely to spend.  To save money when choosing more expensive foods like meats and dairy: ? Choose cheaper cuts of meat, such as bone-in chicken thighs and drumsticks instead of skinless and boneless chicken. When you are ready to prepare the chicken, you can remove the skin yourself to make it healthier. ? Choose lean meats like  chicken or turkey instead of beef. ? Choose canned seafood, such as tuna, salmon, or sardines. ? Buy eggs as a low-cost source of protein. ? Buy dried beans and peas, such as lentils, split peas, or kidney beans instead of meats. Dried beans and peas are a good alternative source of protein. ? Buy the larger tubs of yogurt instead of individual-sized containers.  Choose water instead of sodas and other sweetened beverages.  Avoid buying chips, cookies, and other "junk food." These items are usually expensive and not healthy. Cooking  Make extra food and freeze the extras in meal-sized containers or in individual portions for fast meals and snacks.  Pre-cook on days when you have extra time to prepare meals in advance. You can keep these meals in the fridge or freezer and reheat for a quick meal.  When you come home from the grocery store, wash, peel, and cut fruits and vegetables so they are ready to use and eat. This will help reduce food waste. Meal planning  Do not eat out or get fast food. Prepare food at home.  Make a grocery list and make sure to bring it with you to the store. If you have a smart phone, you could use your phone to create your shopping list.  Plan meals and snacks according to a grocery list and budget you create.  Use leftovers in your meal plan for the week.  Look for recipes where you can cook once and make enough food for two meals.  Include budget-friendly meals like stews, casseroles, and stir-fry dishes.  Try some meatless meals or try "no cook" meals like salads.  Make sure that half your plate is filled with fruits or vegetables. Choose from fresh, frozen, or canned fruits and vegetables. If eating canned, remember to rinse them before eating. This will remove any excess salt added for packaging. Summary  Eating healthy on a budget is possible if you plan your meals according to your budget, purchase according to your budget and grocery list, and  prepare food yourself.  Tips for buying more food on a limited budget include buying generic brands, using coupons only for foods you normally buy, and buying healthy items from the bulk bins when available.  Tips for buying cheaper food to replace expensive food include choosing cheaper, lean cuts of meat, and buying dried beans and peas. This information is not intended to replace advice given to you by your health care provider. Make sure you discuss any questions you have with your health care provider. Document Revised: 11/15/2017 Document Reviewed: 11/15/2017 Elsevier Patient Education  2020 [employer].   Bone Health Bones protect organs, store calcium, anchor muscles, and support the whole body. Keeping your bones strong is important, especially as you   get older. You can take actions to help keep your bones strong and healthy. Why is keeping my bones healthy important?  Keeping your bones healthy is important because your body constantly replaces bone cells. Cells get old, and new cells take their place. As we age, we lose bone cells because the body may not be able to make enough new cells to replace the old cells. The amount of bone cells and bone tissue you have is referred to as bone mass. The higher your bone mass, the stronger your bones. The aging process leads to an overall loss of bone mass in the body, which can increase the likelihood of:  Joint pain and stiffness.  Broken bones.  A condition in which the bones become weak and brittle (osteoporosis). A large decline in bone mass occurs in older adults. In women, it occurs about the time of menopause. What actions can I take to keep my bones healthy? Good health habits are important for maintaining healthy bones. This includes eating nutritious foods and exercising regularly. To have healthy bones, you need to get enough of the right minerals and vitamins. Most nutrition experts recommend getting these nutrients from the  foods that you eat. In some cases, taking supplements may also be recommended. Doing certain types of exercise is also important for bone health. What are the nutritional recommendations for healthy bones?  Eating a well-balanced diet with plenty of calcium and vitamin D will help to protect your bones. Nutritional recommendations vary from person to person. Ask your health care provider what is healthy for you. Here are some general guidelines. Get enough calcium Calcium is the most important (essential) mineral for bone health. Most people can get enough calcium from their diet, but supplements may be recommended for people who are at risk for osteoporosis. Good sources of calcium include:  Dairy products, such as low-fat or nonfat milk, cheese, and yogurt.  Dark green leafy vegetables, such as bok choy and broccoli.  Calcium-fortified foods, such as orange juice, cereal, bread, soy beverages, and tofu products.  Nuts, such as almonds. Follow these recommended amounts for daily calcium intake:  Children, age 1-3: 700 mg.  Children, age 4-8: 1,000 mg.  Children, age 9-13: 1,300 mg.  Teens, age 14-18: 1,300 mg.  Adults, age 19-50: 1,000 mg.  Adults, age 51-70: ? Men: 1,000 mg. ? Women: 1,200 mg.  Adults, age 71 or older: 1,200 mg.  Pregnant and breastfeeding females: ? Teens: 1,300 mg. ? Adults: 1,000 mg. Get enough vitamin D Vitamin D is the most essential vitamin for bone health. It helps the body absorb calcium. Sunlight stimulates the skin to make vitamin D, so be sure to get enough sunlight. If you live in a cold climate or you do not get outside often, your health care provider may recommend that you take vitamin D supplements. Good sources of vitamin D in your diet include:  Egg yolks.  Saltwater fish.  Milk and cereal fortified with vitamin D. Follow these recommended amounts for daily vitamin D intake:  Children and teens, age 1-18: 600 international  units.  Adults, age 50 or younger: 400-800 international units.  Adults, age 51 or older: 800-1,000 international units. Get other important nutrients Other nutrients that are important for bone health include:  Phosphorus. This mineral is found in meat, poultry, dairy foods, nuts, and legumes. The recommended daily intake for adult men and adult women is 700 mg.  Magnesium. This mineral is found in seeds, nuts, dark   green vegetables, and legumes. The recommended daily intake for adult men is 400-420 mg. For adult women, it is 310-320 mg.  Vitamin K. This vitamin is found in green leafy vegetables. The recommended daily intake is 120 mg for adult men and 90 mg for adult women. What type of physical activity is best for building and maintaining healthy bones? Weight-bearing and strength-building activities are important for building and maintaining healthy bones. Weight-bearing activities cause muscles and bones to work against gravity. Strength-building activities increase the strength of the muscles that support bones. Weight-bearing and muscle-building activities include:  Walking and hiking.  Jogging and running.  Dancing.  Gym exercises.  Lifting weights.  Tennis and racquetball.  Climbing stairs.  Aerobics. Adults should get at least 30 minutes of moderate physical activity on most days. Children should get at least 60 minutes of moderate physical activity on most days. Ask your health care provider what type of exercise is best for you. How can I find out if my bone mass is low? Bone mass can be measured with an X-ray test called a bone mineral density (BMD) test. This test is recommended for all women who are age 65 or older. It may also be recommended for:  Men who are age 70 or older.  People who are at risk for osteoporosis because of: ? Having bones that break easily. ? Having a long-term disease that weakens bones, such as kidney disease or rheumatoid  arthritis. ? Having menopause earlier than normal. ? Taking medicine that weakens bones, such as steroids, thyroid hormones, or hormone treatment for breast cancer or prostate cancer. ? Smoking. ? Drinking three or more alcoholic drinks a day. If you find that you have a low bone mass, you may be able to prevent osteoporosis or further bone loss by changing your diet and lifestyle. Where can I find more information? For more information, check out the following websites:  National Osteoporosis Foundation: www.nof.org/patients  National Institutes of Health: www.bones.nih.gov  International Osteoporosis Foundation: www.iofbonehealth.org Summary  The aging process leads to an overall loss of bone mass in the body, which can increase the likelihood of broken bones and osteoporosis.  Eating a well-balanced diet with plenty of calcium and vitamin D will help to protect your bones.  Weight-bearing and strength-building activities are also important for building and maintaining strong bones.  Bone mass can be measured with an X-ray test called a bone mineral density (BMD) test. This information is not intended to replace advice given to you by your health care provider. Make sure you discuss any questions you have with your health care provider. Document Revised: 12/11/2017 Document Reviewed: 12/11/2017 Elsevier Patient Education  2020 [employer].   

## 2020-10-07 NOTE — Progress Notes (Signed)
Gynecology Annual Exam  PCP: Lesleigh Noe, MD  Chief Complaint:  Chief Complaint  Patient presents with  . Gynecologic Exam    History of Present Illness: Patient is a 44 y.o. [id number] presents for annual exam. The patient has no complaints today.   LMP: No LMP recorded. Patient has had an ablation.   Patient reports that she has been feeling well.  She reports that she has continued to have some pain during the type of time of her monthly menstrual cycle.  She has been using tramadol occasionally as needed during these days.  She reports that some months she has no pain and does not need to use tramadol and then other months she will use the tramadol.  Sometimes the tramadol helps relieve her symptoms of menstrual pain and other times the tramadol does not work.  She still has pain which shoots down her legs especially on the right.  She is happy with how she is managing this pain and desires to continue with this plan.  She is not interested in considering hysterectomy at this time.  The patient is sexually active. She denies dyspareunia.  The patient does perform self breast exams.  There is no notable family history of breast or ovarian cancer in her family.  The patient has regular exercise: sometimes, takes Burn classes, desires to continue to exercise more.    The patient denies current symptoms of depression.    Review of Systems: Review of Systems  Constitutional: Negative for chills, fever, malaise/fatigue and weight loss.  HENT: Negative for congestion, hearing loss and sinus pain.   Eyes: Negative for blurred vision and double vision.  Respiratory: Negative for cough, sputum production, shortness of breath and wheezing.   Cardiovascular: Negative for chest pain, palpitations, orthopnea and leg swelling.  Gastrointestinal: Negative for abdominal pain, constipation, diarrhea, nausea and vomiting.  Genitourinary: Negative for dysuria, flank pain, frequency, hematuria and  urgency.  Musculoskeletal: Negative for back pain, falls and joint pain.  Skin: Negative for itching and rash.  Neurological: Negative for dizziness and headaches.  Psychiatric/Behavioral: Negative for depression, substance abuse and suicidal ideas. The patient is not nervous/anxious.     Past Medical History:  Patient Active Problem List   Diagnosis Date Noted  . Thyroid nodule 10/21/2019    2014 had a nuclear screen that was normal. Had been doing annual [place], but now to every 5 years. Per imaging 2019 - [place] head/neck - 5 mm nodule - recommend following if >1 cm.    . History of squamous cell carcinoma 10/21/2019  . Migraine 10/21/2019    Associated with cycle. Managed with Advil primarily   . Overweight (BMI 25.0-29.9) 10/21/2019    Past Surgical History:  Past Surgical History:  Procedure Laterality Date  . ABLATION  2017  . CESAREAN SECTION  12/07/12,08/11/15  . NASAL SINUS SURGERY    . SKIN CANCER EXCISION     under nose  . TUBAL LIGATION  2016    Gynecologic History:  No LMP recorded. Patient has had an ablation. Last Pap: Results were: NIL  Last mammogram: 2021 Results were: BI-RAD I  Obstetric History: [id number]  Family History:  Family History  Problem Relation Age of Onset  . Colon cancer Maternal Uncle 33  . Colon cancer Maternal Uncle 19  . Hyperlipidemia Mother   . Hyperlipidemia Father   . Arthritis Father   . Cancer Father        skin caner  .  Stroke Maternal Grandmother   . Hyperlipidemia Maternal Grandfather   . Stroke Paternal Grandmother   . Mental illness Sister   . Breast cancer Neg Hx     Social History:  Social History   Socioeconomic History  . Marital status: Married    Spouse name: Elta Guadeloupe  . Number of children: 3  . Years of education: College  . Highest education level: Not on file  Occupational History  . Not on file  Tobacco Use  . Smoking status: Former Smoker    Packs/day: 0.10    Years: 10.00    Pack years: 1.00    Types:  Cigarettes    Quit date: 11/29/2003    Years since quitting: 16.8  . Smokeless tobacco: Never Used  Vaping Use  . Vaping Use: Never used  Substance and Sexual Activity  . Alcohol use: Yes    Comment: 1x per week, 1 serving  . Drug use: Never  . Sexual activity: Yes    Birth control/protection: Surgical    Comment: Ablation   Other Topics Concern  . Not on file  Social History Narrative   10/21/19   From: [place]: with Husband Elta Guadeloupe), and 3 children   Work: has a Systems developer      Family: twins - Aiden and Sport and exercise psychologist (2014) and Thomasena Edis (2015)      Enjoys: spend time with family, reading       Exercise: burn boot camp - 3 times a week   Diet: trying to manage calories - low carb - bar for breakfast/shake for lunch, regular meal for dinner      Safety   Seat belts: Yes    Guns: Yes  and secure   Safe in relationships: Yes    Social Determinants of Health   Financial Resource Strain: Low Risk   . Difficulty of Paying Living Expenses: Not hard at all  Food Insecurity:   . Worried About Charity fundraiser in the Last Year: Not on file  . Ran Out of Food in the Last Year: Not on file  Transportation Needs:   . Lack of Transportation (Medical): Not on file  . Lack of Transportation (Non-Medical): Not on file  Physical Activity:   . Days of Exercise per Week: Not on file  . Minutes of Exercise per Session: Not on file  Stress:   . Feeling of Stress : Not on file  Social Connections:   . Frequency of Communication with Friends and Family: Not on file  . Frequency of Social Gatherings with Friends and Family: Not on file  . Attends Religious Services: Not on file  . Active Member of Clubs or Organizations: Not on file  . Attends Archivist Meetings: Not on file  . Marital Status: Not on file  Intimate Partner Violence:   . Fear of Current or Ex-Partner: Not on file  . Emotionally Abused: Not on file  . Physically Abused: Not on file  .  Sexually Abused: Not on file    Allergies:  No Known Allergies  Medications: Prior to Admission medications   Medication Sig Start Date End Date Taking? Authorizing Provider  BIOTIN PO Take by mouth.    [provider]  ferrous sulfate 325 (65 FE) MG tablet Take by mouth.    [provider]  ketoconazole (NIZORAL) 2 % shampoo Apply 1 application topically 2 (two) times a week. 01/27/20   [provider]  levocetirizine (  XYZAL) 5 MG tablet Take by mouth.    [provider]  Multiple Vitamin (MULTI-VITAMIN) tablet Take by mouth.    [provider]  phentermine (ADIPEX-P) 37.5 MG tablet Take 37.5 mg by mouth daily. Patient not taking: Reported on 10/07/2020 03/14/20   [provider]  traMADol (ULTRAM) 50 MG tablet Take 1 tablet (50 mg total) by mouth every 6 (six) hours as needed for severe pain. 10/07/19   Lunabella Badgett, Stefanie Libel, MD  vitamin B-12 (CYANOCOBALAMIN) 100 MCG tablet Take 100 mcg by mouth daily.    [provider]  VITAMIN D PO Take by mouth.    [provider]    Physical Exam Vitals: Blood pressure 128/74, height 5\' 7"  (1.702 m), weight 203 lb 9.6 oz (92.4 kg).  General: NAD HEENT: normocephalic, anicteric Thyroid: no enlargement, no palpable nodules Pulmonary: No increased work of breathing, CTAB Cardiovascular: RRR, distal pulses 2+ Breast: Breast symmetrical, no tenderness, no palpable nodules or masses, no skin or nipple retraction present, no nipple discharge.  No axillary or supraclavicular lymphadenopathy. Abdomen: NABS, soft, non-tender, non-distended.  Umbilicus without lesions.  No hepatomegaly, splenomegaly or masses palpable. No evidence of hernia  Genitourinary:  External: Normal external female genitalia.  Normal urethral meatus, normal Bartholin's and Skene's glands.    Vagina: Normal vaginal mucosa, no evidence of prolapse.    Cervix: Grossly normal in appearance, no bleeding  Uterus:  Non-enlarged, mobile, normal contour.  No CMT  Adnexa: ovaries non-enlarged, no adnexal masses  Rectal: deferred  Lymphatic: no evidence of inguinal lymphadenopathy Extremities: no edema, erythema, or tenderness Neurologic: Grossly intact Psychiatric: mood appropriate, affect full  Female chaperone present for pelvic and breast  portions of the physical exam    Assessment: 44 y.o. G2P1103 routine annual exam  Plan: Problem List Items Addressed This Visit    None    Visit Diagnoses    Health maintenance examination    -  Primary   Need for immunization against influenza       Relevant Orders   Flu Vaccine QUAD 36+ mos IM (Completed)   Encounter for annual routine gynecological examination       Breast cancer screening by mammogram       Relevant Orders   MM 3D SCREEN BREAST BILATERAL   Screening for thyroid disorder       Relevant Orders   TSH   T4, free   Screening for diabetes mellitus       Relevant Orders   Comprehensive metabolic panel   Screening for deficiency anemia       Relevant Orders   CBC   Screening cholesterol level       Relevant Orders   Lipid panel   Encounter for gynecological examination without abnormal finding       Dysmenorrhea          1) Mammogram - recommend yearly screening mammogram.  Mammogram Is up to date  2) STI screening  was not offered and therefore not obtained  3) ASCCP guidelines and rational discussed.  Patient opts for every 3 years screening interval  4) Contraception - the patient is currently using  none.  She is happy with her current form of contraception and plans to continue  5) Colonoscopy -referral made  6) Routine healthcare maintenance including cholesterol, diabetes screening discussed managed by PCP. Will return for fasting labs.   7) Return in about 1 year (around 10/07/2021) for fasting labs asap, annual 1 year.  [facility], [facility] 10/07/2020, 9:12 AM

## 2020-10-15 ENCOUNTER — Other Ambulatory Visit: Payer: Self-pay

## 2020-10-15 ENCOUNTER — Other Ambulatory Visit: Payer: 59

## 2020-10-15 DIAGNOSIS — Z1329 Encounter for screening for other suspected endocrine disorder: Secondary | ICD-10-CM

## 2020-10-15 DIAGNOSIS — Z130 Encounter for screening for diseases of the blood and blood-forming organs and certain disorders involving the immune mechanism: Secondary | ICD-10-CM

## 2020-10-15 DIAGNOSIS — Z131 Encounter for screening for diabetes mellitus: Secondary | ICD-10-CM

## 2020-10-15 DIAGNOSIS — Z13220 Encounter for screening for lipoid disorders: Secondary | ICD-10-CM

## 2020-10-16 LAB — COMPREHENSIVE METABOLIC PANEL
ALT: 14 IU/L (ref 0–32)
AST: 12 IU/L (ref 0–40)
Albumin/Globulin Ratio: 2 (ref 1.2–2.2)
Albumin: 4.3 g/dL (ref 3.8–4.8)
Alkaline Phosphatase: 59 IU/L (ref 44–121)
BUN/Creatinine Ratio: 12 (ref 9–23)
BUN: 9 mg/dL (ref 6–24)
Bilirubin Total: 0.3 mg/dL (ref 0.0–1.2)
CO2: 23 mmol/L (ref 20–29)
Calcium: 9.2 mg/dL (ref 8.7–10.2)
Chloride: 104 mmol/L (ref 96–106)
Creatinine, Ser: 0.77 mg/dL (ref 0.57–1.00)
GFR calc Af Amer: 109 mL/min/{1.73_m2} (ref >59)
GFR calc non Af Amer: 94 mL/min/{1.73_m2} (ref >59)
Globulin, Total: 2.1 g/dL (ref 1.5–4.5)
Glucose: 82 mg/dL (ref 65–99)
Potassium: 4.4 mmol/L (ref 3.5–5.2)
Sodium: 142 mmol/L (ref 134–144)
Total Protein: 6.4 g/dL (ref 6.0–8.5)

## 2020-10-16 LAB — CBC
Hematocrit: 41.7 % (ref 34.0–46.6)
Hemoglobin: 13.6 g/dL (ref 11.1–15.9)
MCH: 31.5 pg (ref 26.6–33.0)
MCHC: 32.6 g/dL (ref 31.5–35.7)
MCV: 97 fL (ref 79–97)
Platelets: 215 10*3/uL (ref 150–450)
RBC: 4.32 x10E6/uL (ref 3.77–5.28)
RDW: 11.6 % — ABNORMAL LOW (ref 11.7–15.4)
WBC: 7.1 10*3/uL (ref 3.4–10.8)

## 2020-10-16 LAB — T4, FREE: Free T4: 1.25 ng/dL (ref 0.82–1.77)

## 2020-10-16 LAB — LIPID PANEL
Chol/HDL Ratio: 3.3 ratio (ref 0.0–4.4)
Cholesterol, Total: 167 mg/dL (ref 100–199)
HDL: 50 mg/dL (ref >39)
LDL Chol Calc (NIH): 99 mg/dL (ref 0–99)
Triglycerides: 101 mg/dL (ref 0–149)
VLDL Cholesterol Cal: 18 mg/dL (ref 5–40)

## 2020-10-16 LAB — TSH: TSH: 2.77 u[IU]/mL (ref 0.450–4.500)

## 2020-10-20 ENCOUNTER — Other Ambulatory Visit: Payer: Self-pay

## 2020-10-20 ENCOUNTER — Telehealth (INDEPENDENT_AMBULATORY_CARE_PROVIDER_SITE_OTHER): Payer: Self-pay | Admitting: Gastroenterology

## 2020-10-20 DIAGNOSIS — Z1211 Encounter for screening for malignant neoplasm of colon: Secondary | ICD-10-CM

## 2020-10-20 NOTE — Progress Notes (Signed)
Gastroenterology Pre-Procedure Review  Request Date: Monday 01/26/20 Requesting Physician: Dr. Marius Ditch  PATIENT REVIEW QUESTIONS: The patient responded to the following health history questions as indicated:    1. Are you having any GI issues? no 2. Do you have a personal history of Polyps? no 3. Do you have a family history of Colon Cancer or Polyps? yes (Maternal Uncle Colon Cancer) 4. Diabetes Mellitus? no 5. Joint replacements in the past 12 months?no 6. Major health problems in the past 3 months?no 7. Any artificial heart valves, MVP, or defibrillator?no    MEDICATIONS & ALLERGIES:    Patient reports the following regarding taking any anticoagulation/antiplatelet therapy:   Plavix, Coumadin, Eliquis, Xarelto, Lovenox, Pradaxa, Brilinta, or Effient? no Aspirin? no  Patient confirms/reports the following medications:  Current Outpatient Medications  Medication Sig Dispense Refill  . BIOTIN PO Take by mouth.    . ferrous sulfate 325 (65 FE) MG tablet Take by mouth.    Marland Kitchen ketoconazole (NIZORAL) 2 % shampoo Apply 1 application topically 2 (two) times a week.    . levocetirizine (XYZAL) 5 MG tablet Take by mouth.    . Multiple Vitamin (MULTI-VITAMIN) tablet Take by mouth.    . traMADol (ULTRAM) 50 MG tablet Take 1 tablet (50 mg total) by mouth every 6 (six) hours as needed for severe pain. 60 tablet 0  . vitamin B-12 (CYANOCOBALAMIN) 100 MCG tablet Take 100 mcg by mouth daily.    Marland Kitchen VITAMIN D PO Take by mouth.    . phentermine (ADIPEX-P) 37.5 MG tablet Take 37.5 mg by mouth daily.  (Patient not taking: Reported on 10/20/2020)     No current facility-administered medications for this visit.    Patient confirms/reports the following allergies:  No Known Allergies  No orders of the defined types were placed in this encounter.   AUTHORIZATION INFORMATION Primary Insurance: 1D#: Group #:  Secondary Insurance: 1D#: Group #:  SCHEDULE INFORMATION: Date:  01/26/20 Time: Location:[facility]

## 2020-10-21 ENCOUNTER — Telehealth: Payer: 59

## 2020-10-26 ENCOUNTER — Other Ambulatory Visit: Payer: Self-pay

## 2020-10-26 ENCOUNTER — Encounter: Payer: Self-pay | Admitting: Family Medicine

## 2020-10-26 ENCOUNTER — Ambulatory Visit (INDEPENDENT_AMBULATORY_CARE_PROVIDER_SITE_OTHER): Payer: 59 | Admitting: Family Medicine

## 2020-10-26 VITALS — BP 110/80 | HR 69 | Temp 96.7°F | Ht 67.5 in | Wt 203.5 lb

## 2020-10-26 DIAGNOSIS — Z0000 Encounter for general adult medical examination without abnormal findings: Secondary | ICD-10-CM

## 2020-10-26 NOTE — Progress Notes (Signed)
Annual Exam   Chief Complaint:  Chief Complaint  Patient presents with  . Annual Exam    labs done at [facility] on 11/18.... /no concerns     History of Present Illness:  Ms. Natalie Robinson is a 44 y.o. [phone] who LMP was No LMP recorded. Patient has had an ablation., presents today for her annual examination.     Nutrition Diet: not great, trying to get back to healthy eating Exercise: working on 10,000 steps per day, scheduled changed and hoping to get back to gym 3 days a week She does get adequate calcium and Vitamin D in her diet.   Social History   Tobacco Use  Smoking Status Former Smoker  . Packs/day: 0.10  . Years: 10.00  . Pack years: 1.00  . Types: Cigarettes  . Quit date: 11/29/2003  . Years since quitting: 16.9  Smokeless Tobacco Never Used   Social History   Substance and Sexual Activity  Alcohol Use Yes   Comment: 1x per week, 1 serving   Social History   Substance and Sexual Activity  Drug Use Never    Safety The patient wears seatbelts: yes.     The patient feels safe at home and in their relationships: yes.  General Health Dentist in the last year: Yes Eye doctor: yes  Menstrual S/p ablation w/o bleeding Will get bad cramps Sometimes will get occasional spotting  Is getting some hot flashes at night - mom went through menopause  GYN She is single partner, contraception - s/p ablation.    Cervical Cancer Screening:   Last Pap:   November 2020 Results were: no abnormalities /neg HPV DNA    Breast Cancer Screening There is no FH of breast cancer. There is no FH of ovarian cancer. BRCA screening No.  Discussed that for average risk women between age 51-49 screening may reduce the risk of breast cancer death, however, at a lower rate than those over age 46. And that the the false-positive rates resulting in unnecessary biopsies with more screening is higher. The balance of benefits vs harms likely improves as you progress through your 40s.  The patient does want a mammogram this year.   Colon Cancer Screening:  Age 87-75 yo - benefits outweigh the risk. Adults 36-85 yo who have never been screened benefit.  Benefits: 134000 people in 2016 will be diagnosed and 49,000 will die - early detection helps Harms: Complications 2/2 to colonoscopy High Risk (Colonoscopy): genetic disorder (Lynch syndrome or familial adenomatous polyposis), personal hx of IBD, previous adenomatous polyp, or previous colorectal cancer, FamHx start 10 years before the age at diagnosis, increased in males and black race  Options:  FIT - looks for hemoglobin (blood in the stool) - specific and fairly sensitive - must be done annually Cologuard - looks for DNA and blood - more sensitive - therefore can have more false positives, every 3 years Colonoscopy - every 10 years if normal - sedation, bowl prep, must have someone drive you  Shared decision making and the patient had decided to do colonoscopy - at age 33.  Weight Wt Readings from Last 3 Encounters:  10/26/20 203 lb 8 oz (92.3 kg)  10/07/20 203 lb 9.6 oz (92.4 kg)  03/26/20 191 lb (86.6 kg)   Patient has high BMI  BMI Readings from Last 1 Encounters:  10/26/20 31.40 kg/m     Chronic disease screening Blood pressure monitoring:  BP Readings from Last 3 Encounters:  10/26/20 110/80  10/07/20  128/74  03/26/20 106/78    Lipid Monitoring: Indication for screening: age >72, obesity, diabetes, family hx, CV risk factors.  Lipid screening: Yes  Lab Results  Component Value Date   CHOL 167 10/15/2020   HDL 50 10/15/2020   [place] 99 10/15/2020   TRIG 101 10/15/2020   CHOLHDL 3.3 10/15/2020     Diabetes Screening: age >35, overweight, family hx, PCOS, hx of gestational diabetes, at risk ethnicity Diabetes Screening screening: Not Indicated  No results found for: HGBA1C   Past Medical History:  Diagnosis Date  . Allergy   . Asthma   . Frequent headaches   . History of chicken pox    . Migraines   . Skin cancer 2017    Past Surgical History:  Procedure Laterality Date  . ABLATION  2017  . CESAREAN SECTION  12/07/12,08/11/15  . NASAL SINUS SURGERY    . SKIN CANCER EXCISION     under nose  . TUBAL LIGATION  2016    Prior to Admission medications   Medication Sig Start Date End Date Taking? Authorizing Provider  Ascorbic Acid (VITAMIN C) 500 MG CAPS Take 1,000 mg by mouth daily.   Yes [provider]  BIOTIN PO Take by mouth.   Yes [provider]  Calcium Carbonate (CALCIUM 600 PO) Take 600 mg by mouth daily.   Yes [provider]  ferrous sulfate 325 (65 FE) MG tablet Take by mouth.   Yes [provider]  ketoconazole (NIZORAL) 2 % shampoo Apply 1 application topically 2 (two) times a week. 01/27/20  Yes [provider]  levocetirizine (XYZAL) 5 MG tablet Take by mouth.   Yes [provider]  Multiple Vitamin (MULTI-VITAMIN) tablet Take by mouth.   Yes [provider]  traMADol (ULTRAM) 50 MG tablet Take 1 tablet (50 mg total) by mouth every 6 (six) hours as needed for severe pain. 10/07/20  Yes Schuman, Stefanie Libel, MD  vitamin B-12 (CYANOCOBALAMIN) 100 MCG tablet Take 100 mcg by mouth daily.   Yes [provider]  VITAMIN D PO Take by mouth.   Yes [provider]    No Known Allergies  Gynecologic History: No LMP recorded. Patient has had an ablation.  Obstetric History: G2P1103  Social History   Socioeconomic History  . Marital status: Married    Spouse name: Elta Guadeloupe  . Number of children: 3  . Years of education: College  . Highest education level: Not on file  Occupational History  . Not on file  Tobacco Use  . Smoking status: Former Smoker    Packs/day: 0.10    Years: 10.00    Pack years: 1.00    Types: Cigarettes    Quit date: 11/29/2003    Years since quitting: 16.9  . Smokeless tobacco: Never Used  Vaping Use  . Vaping Use: Never used  Substance and Sexual  Activity  . Alcohol use: Yes    Comment: 1x per week, 1 serving  . Drug use: Never  . Sexual activity: Yes    Birth control/protection: Surgical    Comment: Ablation   Other Topics Concern  . Not on file  Social History Narrative   10/21/19   From: [place]: with Husband Elta Guadel[place]), and 3 children   Work: has a Systems developer      Family: twins - Aiden and Sport and exercise psychologist (2014) and Thomasena Edis (2015)      Enjoys: spend time with family, reading  Exercise: burn boot camp - 3 times a week   Diet: trying to manage calories - low carb - bar for breakfast/shake for lunch, regular meal for dinner      Safety   Seat belts: Yes    Guns: Yes  and secure   Safe in relationships: Yes    Social Determinants of Health   Financial Resource Strain:   . Difficulty of Paying Living Expenses: Not on file  Food Insecurity:   . Worried About Charity fundraiser in the Last Year: Not on file  . Ran Out of Food in the Last Year: Not on file  Transportation Needs:   . Lack of Transportation (Medical): Not on file  . Lack of Transportation (Non-Medical): Not on file  Physical Activity:   . Days of Exercise per Week: Not on file  . Minutes of Exercise per Session: Not on file  Stress:   . Feeling of Stress : Not on file  Social Connections:   . Frequency of Communication with Friends and Family: Not on file  . Frequency of Social Gatherings with Friends and Family: Not on file  . Attends Religious Services: Not on file  . Active Member of Clubs or Organizations: Not on file  . Attends Archivist Meetings: Not on file  . Marital Status: Not on file  Intimate Partner Violence:   . Fear of Current or Ex-Partner: Not on file  . Emotionally Abused: Not on file  . Physically Abused: Not on file  . Sexually Abused: Not on file    Family History  Problem Relation Age of Onset  . Colon cancer Maternal Uncle 7  . Colon cancer Maternal Uncle 19  . Hyperlipidemia Mother   .  Hyperlipidemia Father   . Arthritis Father   . Cancer Father        skin caner  . Stroke Maternal Grandmother   . Hyperlipidemia Maternal Grandfather   . Stroke Paternal Grandmother   . Mental illness Sister   . Breast cancer Neg Hx     Review of Systems  Constitutional: Negative for chills and fever.  HENT: Negative for congestion and sore throat.   Eyes: Negative for blurred vision and double vision.  Respiratory: Negative for shortness of breath.   Cardiovascular: Negative for chest pain.  Gastrointestinal: Negative for heartburn, nausea and vomiting.  Genitourinary: Negative.   Musculoskeletal: Negative.  Negative for myalgias.  Skin: Negative for rash.  Neurological: Negative for dizziness and headaches.  Endo/Heme/Allergies: Does not bruise/bleed easily.  Psychiatric/Behavioral: Negative for depression. The patient is not nervous/anxious.      Physical Exam BP 110/80   Pulse 69   Temp (!) 96.7 F (35.9 C) (Temporal)   Ht 5' 7.5" (1.715 m)   Wt 203 lb 8 oz (92.3 kg)   SpO2 99%   BMI 31.40 kg/m    BP Readings from Last 3 Encounters:  10/26/20 110/80  10/07/20 128/74  03/26/20 106/78      Physical Exam Constitutional:      General: She is not in acute distress.    Appearance: She is well-developed. She is not diaphoretic.  HENT:     Head: Normocephalic and atraumatic.     Right Ear: External ear normal.     Left Ear: External ear normal.     Nose: Nose normal.  Eyes:     General: No scleral icterus.    Conjunctiva/sclera: Conjunctivae normal.  Cardiovascular:     Rate and  Rhythm: Normal rate and regular rhythm.     Heart sounds: No murmur heard.   Pulmonary:     Effort: Pulmonary effort is normal. No respiratory distress.     Breath sounds: Normal breath sounds. No wheezing.  Abdominal:     General: Bowel sounds are normal. There is no distension.     Palpations: Abdomen is soft. There is no mass.     Tenderness: There is no abdominal tenderness.  There is no guarding or rebound.  Musculoskeletal:        General: Normal range of motion.     Cervical back: Neck supple.  Lymphadenopathy:     Cervical: No cervical adenopathy.  Skin:    General: Skin is warm and dry.     Capillary Refill: Capillary refill takes less than 2 seconds.  Neurological:     Mental Status: She is alert and oriented to person, place, and time.     Deep Tendon Reflexes: Reflexes normal.  Psychiatric:        Behavior: Behavior normal.      Results:  PHQ-9:   Depression screen [facility] 2/9 10/26/2020 10/21/2019  Decreased Interest 0 0  Down, Depressed, Hopeless 0 0  PHQ - 2 Score 0 0       Assessment: 44 y.o. [id number] female here for routine annual physical examination.  Plan: Problem List Items Addressed This Visit    None    Visit Diagnoses    Annual physical exam    -  Primary      Screening: -- Blood pressure screen normal -- cholesterol screening: reviewed gyn labs -- Weight screening: overweight: continue to monitor -- Diabetes Screening: reviewed normal glucose -- Nutrition: Encouraged healthy diet  The 10-year ASCVD risk score Mikey Bussing DC Jr., et al., 2013) is: 0.5%   Values used to calculate the score:     Age: 68 years     Sex: Female     Is Non-Hispanic African American: No     Diabetic: No     Tobacco smoker: No     Systolic Blood Pressure: 258 mmHg     Is BP treated: No     HDL Cholesterol: 50 mg/dL     Total Cholesterol: 167 mg/dL  -- Statin therapy for Age 73-75 with CVD risk >7.5%  Psych -- Depression screening (PHQ-9): negative   Safety -- tobacco screening: not using -- alcohol screening:  low-risk usage. -- no evidence of domestic violence or intimate partner violence.   Cancer Screening -- pap smear not collected per ASCCP guidelines -- family history of breast cancer screening: done. not at high risk. -- Mammogram - up to date -- Colon cancer (age 74+)-- scheduled in  February  Immunizations Immunization History  Administered Date(s) Administered  . Influenza,inj,Quad PF,6+ Mos 08/14/2019, 10/07/2020  . PFIZER SARS-COV-2 Vaccination 02/23/2020, 03/15/2020    -- flu vaccine up to date -- TDAP q10 years unknown, record requested -- Covid-19 Vaccine up to date   Encouraged healthy diet and exercise. Encouraged regular vision and dental care.   Lesleigh Noe, MD

## 2020-10-26 NOTE — Patient Instructions (Signed)
Menopause Medicines  Non-prescription medications for Hot Flashes ? Soy - eat 1-2 servings of soy foods daily ? Herbs: like black cohosh have shown some improvement with hot flashes  Talk with your health care provider before starting any herbal supplements. If prescribed, take vitamins and supplements as told by your health care provider. These may include: ? Calcium. Women age 44 and older should get 1,200 mg (milligrams) of calcium every day. ? Vitamin D. Women need 600-800 International Units of vitamin D each day.  Prescription Medications ? Hormone Treatment: increased risk of breast cancer and cardiovascular disease. Should be used for a short time and in women under 60 have a lower risk overall if they take it ? Non-Hormone Treatment: Paroxetine which is an antidepressant, has been shown to reduce Hot Flashes

## 2020-12-25 ENCOUNTER — Telehealth: Payer: Self-pay

## 2020-12-25 NOTE — Telephone Encounter (Signed)
Pt left a message on triage line that she has had a head cold/sinus infection or bronchitis for 2 weeks. Tested negative for Covid. Has a deep cough. Wanted to see about getting an antibiotic.   I called her back and left a message on that she would need to be seen to get an antibiotic and that we could put on the schedule for the Saturday Clinic tomorrow with Dr Einar Pheasant if would call before 5pm today.

## 2020-12-26 ENCOUNTER — Telehealth (INDEPENDENT_AMBULATORY_CARE_PROVIDER_SITE_OTHER): Payer: 59 | Admitting: Family Medicine

## 2020-12-26 ENCOUNTER — Encounter: Payer: Self-pay | Admitting: Family Medicine

## 2020-12-26 DIAGNOSIS — J0100 Acute maxillary sinusitis, unspecified: Secondary | ICD-10-CM

## 2020-12-26 DIAGNOSIS — J069 Acute upper respiratory infection, unspecified: Secondary | ICD-10-CM

## 2020-12-26 MED ORDER — AMOXICILLIN-POT CLAVULANATE 875-125 MG PO TABS: 1.0000 | ORAL_TABLET | Freq: Two times a day (BID) | ORAL | 0 refills | Status: AC

## 2020-12-26 MED ORDER — BENZONATATE 100 MG PO CAPS: 100.0000 mg | ORAL_CAPSULE | Freq: Three times a day (TID) | ORAL | 0 refills | Status: DC | PRN

## 2020-12-26 NOTE — Progress Notes (Signed)
I connected with Natalie Robinson on 12/26/20 at 11:00 AM EST by video and verified that I am speaking with the correct person using two identifiers.   I discussed the limitations, risks, security and privacy concerns of performing an evaluation and management service by video and the availability of in person appointments. I also discussed with the patient that there may be a patient responsible charge related to this service. The patient expressed understanding and agreed to proceed.  Patient location: Home Provider Location: [place] Participants: Lesleigh Noe and Natalie Robinson   Subjective:     Natalie Robinson is a 45 y.o. female presenting for Cough (Productive X 2 weeks with yellow discharge. (Covid - on 12/16/20)), Nasal Congestion, and Sinusitis     Sinusitis This is a new problem. The current episode started 1 to 4 weeks ago. There has been no fever. Associated symptoms include congestion, coughing, ear pain and sinus pressure. Pertinent negatives include no chills, shortness of breath or sore throat.   Covid negative Nyquil at night, dayquil during the day Cough drops  Coughing things up Thinks she has bronchitis   Review of Systems  Constitutional: Negative for chills.  HENT: Positive for congestion, ear pain and sinus pressure. Negative for postnasal drip and sore throat.   Respiratory: Positive for cough. Negative for shortness of breath.      Social History   Tobacco Use  Smoking Status Former Smoker  . Packs/day: 0.10  . Years: 10.00  . Pack years: 1.00  . Types: Cigarettes  . Quit date: 11/29/2003  . Years since quitting: 17.0  Smokeless Tobacco Never Used        Objective:   BP Readings from Last 3 Encounters:  10/26/20 110/80  10/07/20 128/74  03/26/20 106/78   Wt Readings from Last 3 Encounters:  10/26/20 203 lb 8 oz (92.3 kg)  10/07/20 203 lb 9.6 oz (92.4 kg)  03/26/20 191 lb (86.6 kg)    There were no  vitals taken for this visit.   Physical Exam Constitutional:      Appearance: Normal appearance. She is not ill-appearing.  HENT:     Head: Normocephalic and atraumatic.     Right Ear: External ear normal.     Left Ear: External ear normal.  Eyes:     Conjunctiva/sclera: Conjunctivae normal.  Pulmonary:     Effort: Pulmonary effort is normal. No respiratory distress.     Comments: Coughing occasionally Neurological:     Mental Status: She is alert. Mental status is at baseline.  Psychiatric:        Mood and Affect: Mood normal.        Behavior: Behavior normal.        Thought Content: Thought content normal.        Judgment: Judgment normal.            Assessment & Plan:   Problem List Items Addressed This Visit   None   Visit Diagnoses    Acute non-recurrent maxillary sinusitis    -  Primary   Relevant Medications   amoxicillin-clavulanate (AUGMENTIN) 875-125 MG tablet   benzonatate (TESSALON PERLES) 100 MG capsule   Viral URI with cough       Relevant Medications   benzonatate (TESSALON PERLES) 100 MG capsule     2 weeks of symptoms discussed antibiotics for likely sinus infection  Tessalon perles and honey for cough    Return if symptoms worsen or fail to improve.  Lesleigh Noe, MD

## 2021-01-21 ENCOUNTER — Other Ambulatory Visit: Payer: Self-pay

## 2021-01-21 ENCOUNTER — Other Ambulatory Visit
Admission: RE | Admit: 2021-01-21 | Discharge: 2021-01-21 | Disposition: A | Discharge status: Home / Self Care | Payer: 59 | Source: Ambulatory Visit | Attending: Gastroenterology | Admitting: Gastroenterology

## 2021-01-21 DIAGNOSIS — Z20822 Contact with and (suspected) exposure to covid-19: Secondary | ICD-10-CM | POA: Diagnosis not present

## 2021-01-21 DIAGNOSIS — Z01812 Encounter for preprocedural laboratory examination: Secondary | ICD-10-CM | POA: Insufficient documentation

## 2021-01-21 LAB — SARS CORONAVIRUS 2 (TAT 6-24 HRS): SARS Coronavirus 2: NEGATIVE

## 2021-01-22 ENCOUNTER — Other Ambulatory Visit: Payer: 59

## 2021-01-22 ENCOUNTER — Encounter: Payer: Self-pay | Admitting: Gastroenterology

## 2021-01-25 ENCOUNTER — Ambulatory Visit
Admission: RE | Admit: 2021-01-25 | Discharge: 2021-01-25 | Disposition: A | Discharge status: Home / Self Care | Payer: 59 | Attending: Gastroenterology | Admitting: Gastroenterology

## 2021-01-25 ENCOUNTER — Ambulatory Visit: Payer: 59 | Admitting: Certified Registered"

## 2021-01-25 ENCOUNTER — Encounter
Admission: RE | Disposition: A | Discharge status: Home / Self Care | Payer: Self-pay | Source: Home / Self Care | Attending: Gastroenterology

## 2021-01-25 ENCOUNTER — Other Ambulatory Visit: Payer: Self-pay

## 2021-01-25 DIAGNOSIS — Z808 Family history of malignant neoplasm of other organs or systems: Secondary | ICD-10-CM | POA: Insufficient documentation

## 2021-01-25 DIAGNOSIS — Z85828 Personal history of other malignant neoplasm of skin: Secondary | ICD-10-CM | POA: Diagnosis not present

## 2021-01-25 DIAGNOSIS — Z87891 Personal history of nicotine dependence: Secondary | ICD-10-CM | POA: Diagnosis not present

## 2021-01-25 DIAGNOSIS — Z1211 Encounter for screening for malignant neoplasm of colon: Secondary | ICD-10-CM

## 2021-01-25 DIAGNOSIS — Z79899 Other long term (current) drug therapy: Secondary | ICD-10-CM | POA: Diagnosis not present

## 2021-01-25 DIAGNOSIS — Z9851 Tubal ligation status: Secondary | ICD-10-CM | POA: Diagnosis not present

## 2021-01-25 DIAGNOSIS — Z800 Family history of malignant neoplasm of digestive organs: Secondary | ICD-10-CM | POA: Diagnosis not present

## 2021-01-25 DIAGNOSIS — Z8619 Personal history of other infectious and parasitic diseases: Secondary | ICD-10-CM | POA: Insufficient documentation

## 2021-01-25 HISTORY — PX: COLONOSCOPY WITH PROPOFOL: SHX5780

## 2021-01-25 LAB — POCT PREGNANCY, URINE: Preg Test, Ur: NEGATIVE

## 2021-01-25 SURGERY — COLONOSCOPY WITH PROPOFOL
Anesthesia: General

## 2021-01-25 MED ORDER — EPHEDRINE SULFATE 50 MG/ML IJ SOLN
INTRAMUSCULAR | Status: DC | PRN
  Administered 2021-01-25: 5 mg via INTRAVENOUS

## 2021-01-25 MED ORDER — SODIUM CHLORIDE 0.9 % IV SOLN
INTRAVENOUS | Status: DC
  Administered 2021-01-25: 20 mL/h via INTRAVENOUS

## 2021-01-25 MED ORDER — GLYCOPYRROLATE 0.2 MG/ML IJ SOLN
INTRAMUSCULAR | Status: DC | PRN
  Administered 2021-01-25: .2 mg via INTRAVENOUS

## 2021-01-25 MED ORDER — MIDAZOLAM HCL 2 MG/2ML IJ SOLN
INTRAMUSCULAR | Status: AC
  Filled 2021-01-25: qty 2

## 2021-01-25 MED ORDER — LIDOCAINE HCL (CARDIAC) PF 100 MG/5ML IV SOSY
PREFILLED_SYRINGE | INTRAVENOUS | Status: DC | PRN
  Administered 2021-01-25: 50 mg via INTRAVENOUS

## 2021-01-25 MED ORDER — EPHEDRINE 5 MG/ML INJ
INTRAVENOUS | Status: AC
  Filled 2021-01-25: qty 10

## 2021-01-25 MED ORDER — PROPOFOL 10 MG/ML IV BOLUS
INTRAVENOUS | Status: DC | PRN
  Administered 2021-01-25: 50 mg via INTRAVENOUS
  Administered 2021-01-25: 30 mg via INTRAVENOUS
  Administered 2021-01-25: 20 mg via INTRAVENOUS

## 2021-01-25 MED ORDER — MIDAZOLAM HCL 2 MG/2ML IJ SOLN
INTRAMUSCULAR | Status: DC | PRN
  Administered 2021-01-25: 2 mg via INTRAVENOUS

## 2021-01-25 MED ORDER — PROPOFOL 500 MG/50ML IV EMUL
INTRAVENOUS | Status: DC | PRN
  Administered 2021-01-25: 125 ug/kg/min via INTRAVENOUS

## 2021-01-25 MED ORDER — GLYCOPYRROLATE 0.2 MG/ML IJ SOLN
INTRAMUSCULAR | Status: AC
  Filled 2021-01-25: qty 1

## 2021-01-25 MED ORDER — PROPOFOL 500 MG/50ML IV EMUL
INTRAVENOUS | Status: AC
  Filled 2021-01-25: qty 50

## 2021-01-25 NOTE — Transfer of Care (Signed)
Immediate Anesthesia Transfer of Care Note  Patient: Natalie Robinson  Procedure(s) Performed: COLONOSCOPY WITH PROPOFOL (N/A )  Patient Location: PACU  Anesthesia Type:General  Level of Consciousness: drowsy and patient cooperative  Airway & Oxygen Therapy: Patient Spontanous Breathing  Post-op Assessment: Report given to RN and Post -op Vital signs reviewed and stable  Post vital signs: Reviewed and stable  Last Vitals:  Vitals Value Taken Time  BP 86/43 01/25/21 0836  Temp    Pulse 80 01/25/21 0836  Resp 21 01/25/21 0836  SpO2 98 % 01/25/21 0836  Vitals shown include unvalidated device data.  Last Pain:  Vitals:   01/25/21 0727  PainSc: 0-No pain         Complications: No complications documented.

## 2021-01-25 NOTE — Anesthesia Procedure Notes (Signed)
Procedure Name: MAC Date/Time: 01/25/2021 8:12 AM Performed by: Jerrye Noble, CRNA Pre-anesthesia Checklist: Patient identified, Emergency Drugs available, Suction available and Patient being monitored Patient Re-evaluated:Patient Re-evaluated prior to induction Oxygen Delivery Method: Nasal cannula

## 2021-01-25 NOTE — H&P (Signed)
Natalie Darby, MD [address]  [place]  [place], [place] [address]  Main: [phone]  Fax: [phone] Pager: [phone]  Primary Care Physician:  Lesleigh Noe, MD Primary Gastroenterologist:  Dr. Cephas Robinson  Pre-Procedure History & Physical: HPI:  Natalie Robinson is a 45 y.o. female is here for an colonoscopy.   Past Medical History:  Diagnosis Date  . Allergy   . Asthma   . Frequent headaches   . History of chicken pox   . Migraines   . Skin cancer 2017    Past Surgical History:  Procedure Laterality Date  . ABLATION  2017  . CESAREAN SECTION  12/07/12,08/11/15  . NASAL SINUS SURGERY    . SKIN CANCER EXCISION     under nose  . TUBAL LIGATION  2016    Prior to Admission medications   Medication Sig Start Date End Date Taking? Authorizing Provider  Ascorbic Acid (VITAMIN C) 500 MG CAPS Take 1,000 mg by mouth daily.   Yes [provider]  benzonatate (TESSALON PERLES) 100 MG capsule Take 1 capsule (100 mg total) by mouth 3 (three) times daily as needed for cough. 12/26/20  Yes Lesleigh Noe, MD  BIOTIN PO Take by mouth.   Yes [provider]  Calcium Carbonate (CALCIUM 600 PO) Take 600 mg by mouth daily.   Yes [provider]  ferrous sulfate 325 (65 FE) MG tablet Take by mouth.   Yes [provider]  ketoconazole (NIZORAL) 2 % shampoo Apply 1 application topically 2 (two) times a week. 01/27/20  Yes [provider]  levocetirizine (XYZAL) 5 MG tablet Take by mouth.   Yes [provider]  Multiple Vitamin (MULTI-VITAMIN) tablet Take by mouth.   Yes [provider]  traMADol (ULTRAM) 50 MG tablet Take 1 tablet (50 mg total) by mouth every 6 (six) hours as needed for severe pain. 10/07/20  Yes Schuman, Stefanie Libel, MD  vitamin B-12 (CYANOCOBALAMIN) 100 MCG tablet Take 100 mcg by mouth daily.   Yes [provider]  VITAMIN D PO Take by mouth.   Yes [provider]     Allergies as of 10/26/2020  . (No Known Allergies)    Family History  Problem Relation Age of Onset  . Colon cancer Maternal Uncle 64  . Colon cancer Maternal Uncle 42  . Hyperlipidemia Mother   . Hyperlipidemia Father   . Arthritis Father   . Cancer Father        skin caner  . Stroke Maternal Grandmother   . Hyperlipidemia Maternal Grandfather   . Stroke Paternal Grandmother   . Mental illness Sister   . Breast cancer Neg Hx     Social History   Socioeconomic History  . Marital status: Married    Spouse name: Elta Guadeloupe  . Number of children: 3  . Years of education: College  . Highest education level: Not on file  Occupational History  . Not on file  Tobacco Use  . Smoking status: Former Smoker    Packs/day: 0.10    Years: 10.00    Pack years: 1.00    Types: Cigarettes    Quit date: 11/29/2003    Years since quitting: 17.1  . Smokeless tobacco: Never Used  Vaping Use  . Vaping Use: Never used  Substance and Sexual Activity  . Alcohol use: Yes    Comment: 1x per week, 1 serving  . Drug use: Never  . Sexual activity: Yes  Birth control/protection: Surgical    Comment: Ablation   Other Topics Concern  . Not on file  Social History Narrative   10/21/19   From: [place]: with Husband Elta Guadeloupe), and 3 children   Work: has a Systems developer      Family: twins - Aiden and Sport and exercise psychologist (2014) and Thomasena Edis (2015)      Enjoys: spend time with family, reading       Exercise: burn [employer] camp - 3 times a week   Diet: trying to manage calories - low carb - bar for breakfast/shake for lunch, regular meal for dinner      Safety   Seat belts: Yes    Guns: Yes  and secure   Safe in relationships: Yes    Social Determinants of Health   Financial Resource Strain: Not on file  Food Insecurity: Not on file  Transportation Needs: Not on file  Physical Activity: Not on file  Stress: Not on file  Social Connections: Not on file  Intimate Partner Violence:  Not on file    Review of Systems: See HPI, otherwise negative ROS  Physical Exam: BP 126/81   Pulse 65   Temp (!) 96.7 F (35.9 C)   Resp 20   Ht 5\' 7"  (1.702 m)   Wt 88.5 kg   SpO2 100%   BMI 30.54 kg/m  General:   Alert,  pleasant and cooperative in NAD Head:  Normocephalic and atraumatic. Neck:  Supple; no masses or thyromegaly. Lungs:  Clear throughout to auscultation.    Heart:  Regular rate and rhythm. Abdomen:  Soft, nontender and nondistended. Normal bowel sounds, without guarding, and without rebound.   Neurologic:  Alert and  oriented x4;  grossly normal neurologically.  Impression/Plan: Natalie Robinson is here for an colonoscopy to be performed for colon cancer screening  Risks, benefits, limitations, and alternatives regarding  colonoscopy have been reviewed with the patient.  Questions have been answered.  All parties agreeable.   Sherri Sear, MD  01/25/2021, 8:10 AM

## 2021-01-25 NOTE — Op Note (Signed)
[facility] Gastroenterology Patient Name: Natalie Robinson Procedure Date: 01/25/2021 8:10 AM MRN: [id number] Account #: [id number] Date of Birth: 1976-06-16 Admit Type: Outpatient Age: 45 Room: [facility] ENDO ROOM 1 Gender: Female Note Status: Finalized Procedure:             Colonoscopy Indications:           Screening for colorectal malignant neoplasm, This is                         the patient's first colonoscopy Providers:             Lin Landsman MD, MD Referring MD:          Jobe Marker. Einar Pheasant (Referring MD) Medicines:             General Anesthesia Complications:         No immediate complications. Estimated blood loss: None. Procedure:             Pre-Anesthesia Assessment:                        - Prior to the procedure, a History and Physical was                         performed, and patient medications and allergies were                         reviewed. The patient is competent. The risks and                         benefits of the procedure and the sedation options and                         risks were discussed with the patient. All questions                         were answered and informed consent was obtained.                         Patient identification and proposed procedure were                         verified by the physician, the nurse, the                         anesthesiologist, the anesthetist and the technician                         in the pre-procedure area in the procedure room in the                         endoscopy suite. Mental Status Examination: alert and                         oriented. Airway Examination: normal oropharyngeal                         airway and neck mobility. Respiratory Examination:  clear to auscultation. CV Examination: normal.                         Prophylactic Antibiotics: The patient does not require                         prophylactic antibiotics. Prior  Anticoagulants: The                         patient has taken no previous anticoagulant or                         antiplatelet agents. ASA Grade Assessment: II - A                         patient with mild systemic disease. After reviewing                         the risks and benefits, the patient was deemed in                         satisfactory condition to undergo the procedure. The                         anesthesia plan was to use general anesthesia.                         Immediately prior to administration of medications,                         the patient was re-assessed for adequacy to receive                         sedatives. The heart rate, respiratory rate, oxygen                         saturations, blood pressure, adequacy of pulmonary                         ventilation, and response to care were monitored                         throughout the procedure. The physical status of the                         patient was re-assessed after the procedure.                        After obtaining informed consent, the colonoscope was                         passed under direct vision. Throughout the procedure,                         the patient's blood pressure, pulse, and oxygen                         saturations were monitored continuously. The  Colonoscope was introduced through the anus and                         advanced to the the terminal ileum, with                         identification of the appendiceal orifice and IC                         valve. The colonoscopy was performed without                         difficulty. The patient tolerated the procedure well.                         The quality of the bowel preparation was evaluated                         using the BBPS [facility] Bowel Preparation Scale) with                         scores of: Right Colon = 3, Transverse Colon = 3 and                         Left Colon = 3 (entire mucosa  seen well with no                         residual staining, small fragments of stool or opaque                         liquid). The total BBPS score equals 9. Findings:      The perianal and digital rectal examinations were normal. Pertinent       negatives include normal sphincter tone and no palpable rectal lesions.      The terminal ileum appeared normal.      A diminutive polyp was found in the transverse colon. The polyp was       sessile. The polyp was removed with a cold biopsy forceps. Resection and       retrieval were complete.      The retroflexed view of the distal rectum and anal verge was normal and       showed no anal or rectal abnormalities.      The exam was otherwise without abnormality. Impression:            - The examined portion of the ileum was normal.                        - One diminutive polyp in the transverse colon,                         removed with a cold biopsy forceps. Resected and                         retrieved.                        - The distal rectum and anal verge are normal on  retroflexion view.                        - The examination was otherwise normal. Recommendation:        - Discharge patient to home (with escort).                        - Resume previous diet today.                        - Continue present medications.                        - Await pathology results.                        - Repeat colonoscopy in 7-10 years for surveillance                         based on pathology results. Procedure Code(s):     --- Professional ---                        [phone], Colonoscopy, flexible; with biopsy, single or                         multiple Diagnosis Code(s):     --- Professional ---                        Z12.11, Encounter for screening for malignant neoplasm                         of colon                        K63.5, Polyp of colon CPT copyright 2019 American Medical Association. All rights  reserved. The codes documented in this report are preliminary and upon coder review may  be revised to meet current compliance requirements. Dr. Ulyess Mort Lin Landsman MD, MD 01/25/2021 8:33:18 AM This report has been signed electronically. Number of Addenda: 0 Note Initiated On: 01/25/2021 8:10 AM Scope Withdrawal Time: 0 hours 9 minutes 6 seconds  Total Procedure Duration: 0 hours 13 minutes 32 seconds  Estimated Blood Loss:  Estimated blood loss: none.      [facility]

## 2021-01-25 NOTE — Anesthesia Postprocedure Evaluation (Signed)
Anesthesia Post Note  Patient: Secretary/administrator  Procedure(s) Performed: COLONOSCOPY WITH PROPOFOL (N/A )  Patient location during evaluation: Endoscopy Anesthesia Type: General Level of consciousness: awake and alert Pain management: pain level controlled Vital Signs Assessment: post-procedure vital signs reviewed and stable Respiratory status: spontaneous breathing, nonlabored ventilation, respiratory function stable and patient connected to nasal cannula oxygen Cardiovascular status: blood pressure returned to baseline and stable Postop Assessment: no apparent nausea or vomiting Anesthetic complications: no   No complications documented.   Last Vitals:  Vitals:   01/25/21 0846 01/25/21 0857  BP: (!) 94/55 111/72  Pulse: 79 72  Resp: 16 15  Temp:    SpO2: 100% 99%    Last Pain:  Vitals:   01/25/21 0857  PainSc: 0-No pain                 Arita Miss

## 2021-01-25 NOTE — Anesthesia Preprocedure Evaluation (Signed)
Anesthesia Evaluation  Patient identified by MRN, date of birth, ID band Patient awake    Reviewed: Allergy & Precautions, NPO status , Patient's Chart, lab work & pertinent test results  History of Anesthesia Complications Negative for: history of anesthetic complications  Airway Mallampati: II  TM Distance: >3 FB Neck ROM: Full    Dental no notable dental hx. (+) Teeth Intact   Pulmonary neg pulmonary ROS, neg sleep apnea, neg COPD, Patient abstained from smoking.Not current smoker, former smoker,  childhood asthma   Pulmonary exam normal breath sounds clear to auscultation       Cardiovascular Exercise Tolerance: Good METS(-) hypertension(-) CAD and (-) Past MI negative cardio ROS  (-) dysrhythmias  Rhythm:Regular Rate:Normal - Systolic murmurs    Neuro/Psych  Headaches, negative psych ROS   GI/Hepatic neg GERD  ,(+)     (-) substance abuse  ,   Endo/Other  neg diabetes  Renal/GU negative Renal ROS     Musculoskeletal   Abdominal   Peds  Hematology   Anesthesia Other Findings Past Medical History: No date: Allergy No date: Asthma No date: Frequent headaches No date: History of chicken pox No date: Migraines 2017: Skin cancer  Reproductive/Obstetrics                             Anesthesia Physical Anesthesia Plan  ASA: II  Anesthesia Plan: General   Post-op Pain Management:    Induction: Intravenous  PONV Risk Score and Plan: 3 and Ondansetron, Propofol infusion and TIVA  Airway Management Planned: Nasal Cannula  Additional Equipment: None  Intra-op Plan:   Post-operative Plan:   Informed Consent: I have reviewed the patients History and Physical, chart, labs and discussed the procedure including the risks, benefits and alternatives for the proposed anesthesia with the patient or authorized representative who has indicated his/her understanding and acceptance.      Dental advisory given  Plan Discussed with: CRNA and Surgeon  Anesthesia Plan Comments: (Discussed risks of anesthesia with patient, including possibility of difficulty with spontaneous ventilation under anesthesia necessitating airway intervention, PONV, and rare risks such as cardiac or respiratory or neurological events. Patient understands.)        Anesthesia Quick Evaluation

## 2021-01-26 LAB — SURGICAL PATHOLOGY

## 2021-01-27 ENCOUNTER — Encounter: Payer: Self-pay | Admitting: Gastroenterology

## 2021-01-28 ENCOUNTER — Encounter: Payer: Self-pay | Admitting: Gastroenterology

## 2021-02-22 ENCOUNTER — Other Ambulatory Visit: Payer: Self-pay

## 2021-02-22 ENCOUNTER — Ambulatory Visit
Admission: RE | Admit: 2021-02-22 | Discharge: 2021-02-22 | Disposition: A | Discharge status: Home / Self Care | Payer: 59 | Source: Ambulatory Visit | Attending: Obstetrics and Gynecology | Admitting: Obstetrics and Gynecology

## 2021-02-22 DIAGNOSIS — Z1231 Encounter for screening mammogram for malignant neoplasm of breast: Secondary | ICD-10-CM | POA: Insufficient documentation

## 2021-03-10 ENCOUNTER — Encounter: Payer: Self-pay | Admitting: Family Medicine

## 2021-03-10 ENCOUNTER — Ambulatory Visit (INDEPENDENT_AMBULATORY_CARE_PROVIDER_SITE_OTHER): Payer: 59 | Admitting: Family Medicine

## 2021-03-10 ENCOUNTER — Other Ambulatory Visit: Payer: Self-pay

## 2021-03-10 DIAGNOSIS — R21 Rash and other nonspecific skin eruption: Secondary | ICD-10-CM | POA: Diagnosis not present

## 2021-03-10 MED ORDER — PREDNISONE 20 MG PO TABS: ORAL_TABLET | ORAL | 0 refills | Status: DC

## 2021-03-10 MED ORDER — HYDROXYZINE PAMOATE 25 MG PO CAPS: 25.0000 mg | ORAL_CAPSULE | Freq: Two times a day (BID) | ORAL | 0 refills | Status: DC | PRN

## 2021-03-10 NOTE — Patient Instructions (Signed)
You are right this sounds like allergic rash from scratching.  Treat with prednisone taper sent to pharmacy May use hydroxyzine 25mg  as needed in place of benadryl - with sedation precautions. Continue xyzal.  Let us know if not improving with above treatment.  I'm glad you have upcoming allergist evaluation.

## 2021-03-10 NOTE — Progress Notes (Signed)
Patient ID: Natalie Robinson, female    DOB: 1976/10/27, 45 y.o.   MRN: [id number]  This visit was conducted in person.  BP 110/72   Pulse 71   Temp 97.8 F (36.6 C) (Temporal)   Ht 5\' 7"  (1.702 m)   Wt 207 lb 8 oz (94.1 kg)   SpO2 98%   BMI 32.50 kg/m    CC: itchy rash  Subjective:   HPI: [name] is a 45 y.o. female presenting on 03/10/2021 for Rash (C/o extremely itchy rash on bilateral arms.  Disturbs sleep. Started about 2 wks ago.  Usually gets every yr. )   2 wk h/o itchy rash to bilateral arms. Itching also to chest without rash. Itch that then rashed.   No h/o eczema. Has been using hot showers.  Treating with oral benadryl, benadryl cream, regularly on xyzal.  Tends to get itchy rash yearly around this time - attributes this to yearly allergies   No new lotions, detergents, soaps or shampoos. No new medicines, supplements. No new foods.  No other rashes at home.  No new pets.  [place]  No recent sun exposure, no change in sunscreen use. She has recently been wearing long sleeves when outdoors. Doesn't have this trouble in the summers.      Relevant past medical, surgical, family and social history reviewed and updated as indicated. Interim medical history since our last visit reviewed. Allergies and medications reviewed and updated. Outpatient Medications Prior to Visit  Medication Sig Dispense Refill  . Ascorbic Acid (VITAMIN C) 500 MG CAPS Take 1,000 mg by mouth daily.    . benzonatate (TESSALON PERLES) 100 MG capsule Take 1 capsule (100 mg total) by mouth 3 (three) times daily as needed for cough. 20 capsule 0  . BIOTIN PO Take by mouth.    . Calcium Carbonate (CALCIUM 600 PO) Take 600 mg by mouth daily.    . ferrous sulfate 325 (65 FE) MG tablet Take by mouth.    Marland Kitchen ketoconazole (NIZORAL) 2 % shampoo Apply 1 application topically 2 (two) times a week.    . levocetirizine (XYZAL) 5 MG tablet  Take by mouth.    . Multiple Vitamin (MULTI-VITAMIN) tablet Take by mouth.    . traMADol (ULTRAM) 50 MG tablet Take 1 tablet (50 mg total) by mouth every 6 (six) hours as needed for severe pain. 60 tablet 0  . vitamin B-12 (CYANOCOBALAMIN) 100 MCG tablet Take 100 mcg by mouth daily.    Marland Kitchen VITAMIN D PO Take by mouth.     No facility-administered medications prior to visit.     Per HPI unless specifically indicated in ROS section below Review of Systems Objective:  BP 110/72   Pulse 71   Temp 97.8 F (36.6 C) (Temporal)   Ht 5\' 7"  (1.702 m)   Wt 207 lb 8 oz (94.1 kg)   SpO2 98%   BMI 32.50 kg/m   Wt Readings from Last 3 Encounters:  03/10/21 207 lb 8 oz (94.1 kg)  01/25/21 195 lb (88.5 kg)  10/26/20 203 lb 8 oz (92.3 kg)      Physical Exam Vitals and nursing note reviewed.  Constitutional:      Appearance: Normal appearance. She is not ill-appearing.  Skin:    General: Skin is warm and dry.     Findings: Erythema and rash present. Rash is papular. Rash is not urticarial or vesicular.  Comments: Pruritic papular mildly erythematous rash to bilateral forearms with sparing of hands/palms, some erythema with fine papular rash around neck without significant itch  Neurological:     Mental Status: She is alert.  Psychiatric:        Mood and Affect: Mood normal.        Behavior: Behavior normal.       Assessment & Plan:  This visit occurred during the SARS-CoV-2 public health emergency.  Safety protocols were in place, including screening questions prior to the visit, additional usage of staff PPE, and extensive cleaning of exam room while observing appropriate contact time as indicated for disinfecting solutions.   Problem List Items Addressed This Visit    Skin rash    Itch that rashes, tends to be recurrent issue around this time of year suggesting allergic component. Will treat with prednisone taper with steroid precautions as well as hydroxyzine in place of benadryl.  Discussed limiting hot showers, using lukewarm water instead. She already has upcoming allergist eval (04/2021).  Update if not improving with this.           Meds ordered this encounter  Medications  . predniSONE (DELTASONE) 20 MG tablet    Sig: Take two tablets daily for 4 days followed by one tablet daily for 4 days    Dispense:  12 tablet    Refill:  0  . hydrOXYzine (VISTARIL) 25 MG capsule    Sig: Take 1 capsule (25 mg total) by mouth 2 (two) times daily as needed for itching.    Dispense:  30 capsule    Refill:  0   No orders of the defined types were placed in this encounter.   Patient Instructions  You are right this sounds like allergic rash from scratching.  Treat with prednisone taper sent to pharmacy May use hydroxyzine 25mg  as needed in place of benadryl - with sedation precautions. Continue xyzal.  Let us know if not improving with above treatment.  I'm glad you have upcoming allergist evaluation.   Follow up plan: Return if symptoms worsen or fail to improve.  Ria Bush, MD

## 2021-03-10 NOTE — Assessment & Plan Note (Addendum)
Itch that rashes, tends to be recurrent issue around this time of year suggesting allergic component. Will treat with prednisone taper with steroid precautions as well as hydroxyzine in place of benadryl. Discussed limiting hot showers, using lukewarm water instead. She already has upcoming allergist eval (04/2021).  Update if not improving with this.

## 2021-03-18 ENCOUNTER — Other Ambulatory Visit: Payer: Self-pay | Admitting: Family Medicine

## 2021-03-22 NOTE — Progress Notes (Signed)
Cancelled appointment

## 2021-04-27 IMAGING — MG MM DIGITAL SCREENING BILAT W/ TOMO AND CAD
6 of 12 series · 6 of 36 positions shown · non-contrast
Comparison: Previous exam(s).

CLINICAL DATA: Screening.

EXAM:
DIGITAL SCREENING BILATERAL MAMMOGRAM WITH TOMOSYNTHESIS AND CAD
TECHNIQUE: Bilateral screening digital craniocaudal and mediolateral oblique
mammograms were obtained. Bilateral screening digital breast
tomosynthesis was performed. The images were evaluated with
computer-aided detection.

[R MLO synth-2D (1 of 2)]
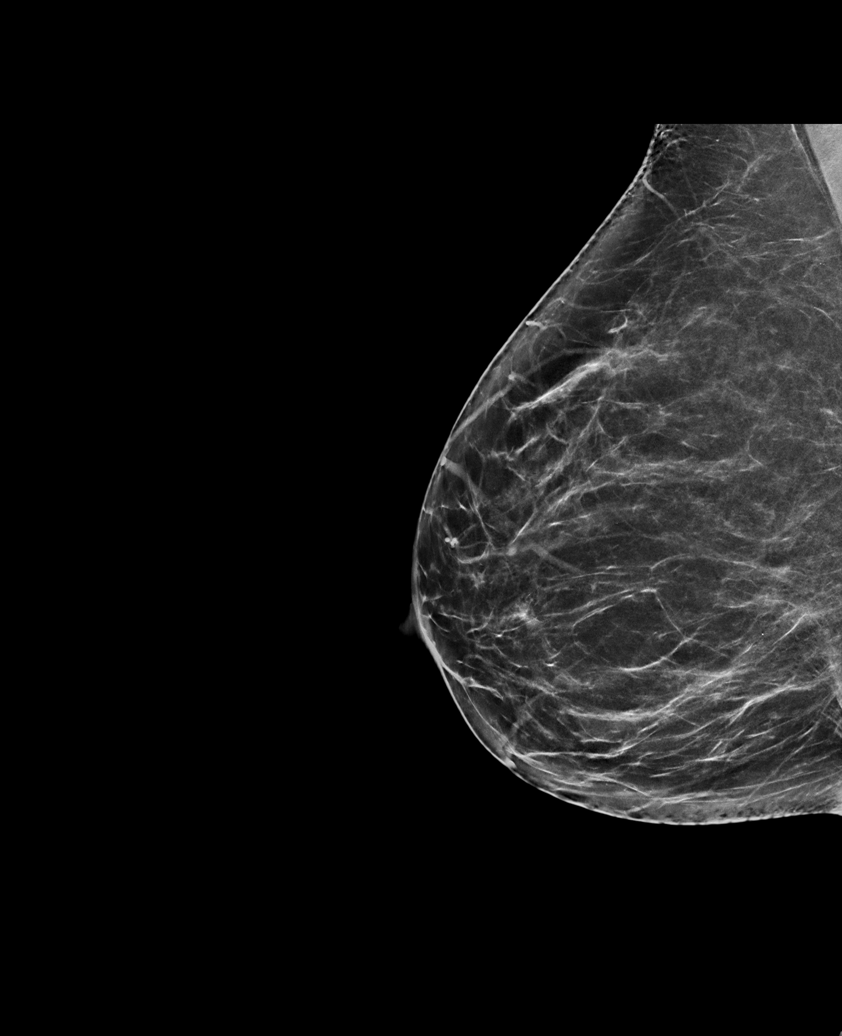

[L MLO synth-2D (1 of 2)]
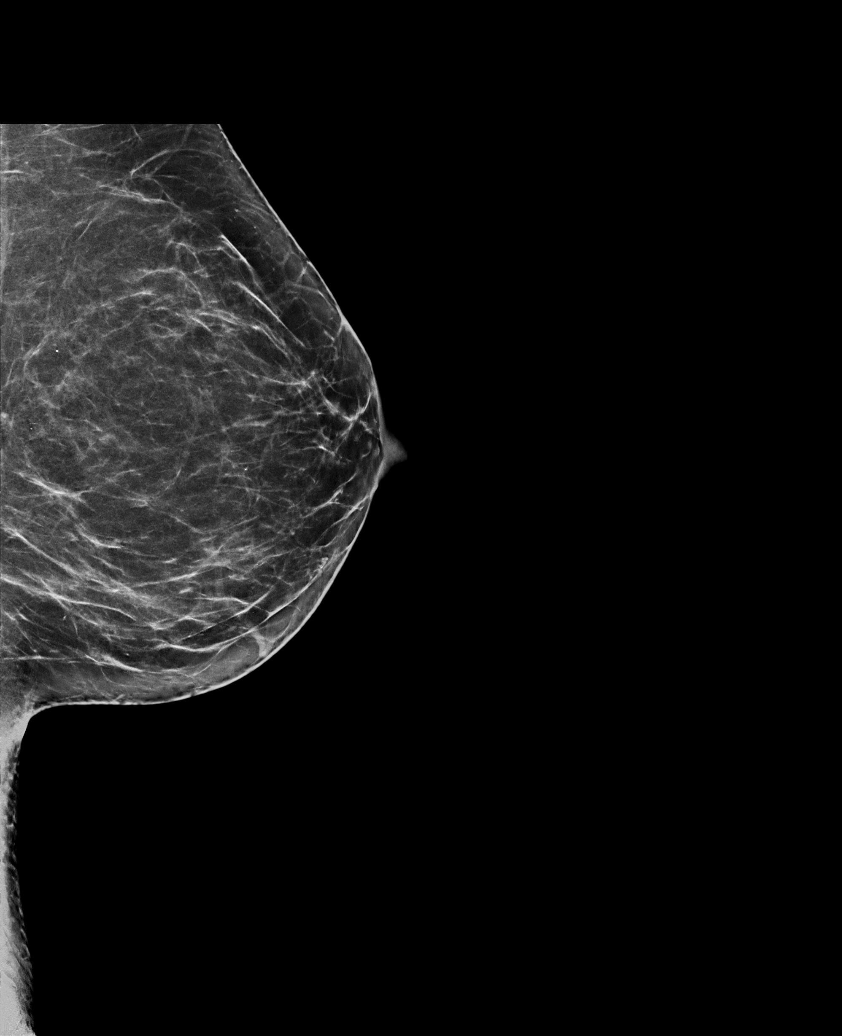

[L MLO synth-2D (2 of 2)]
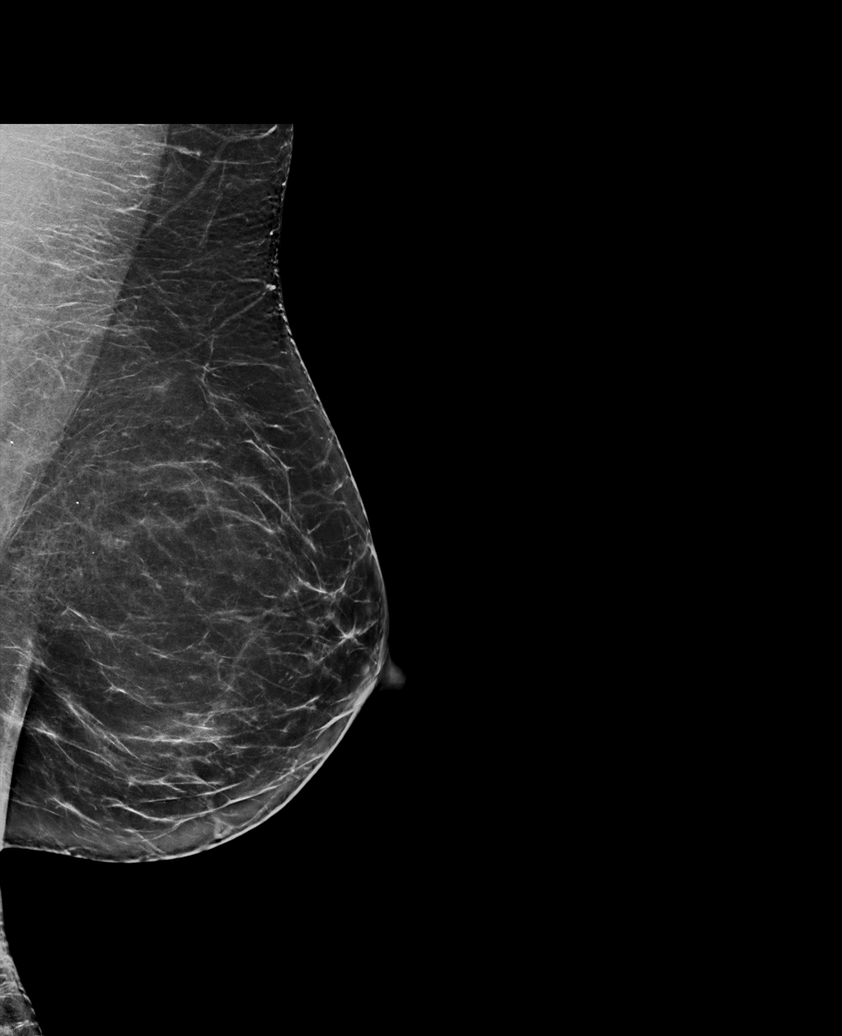

[L CC synth-2D]
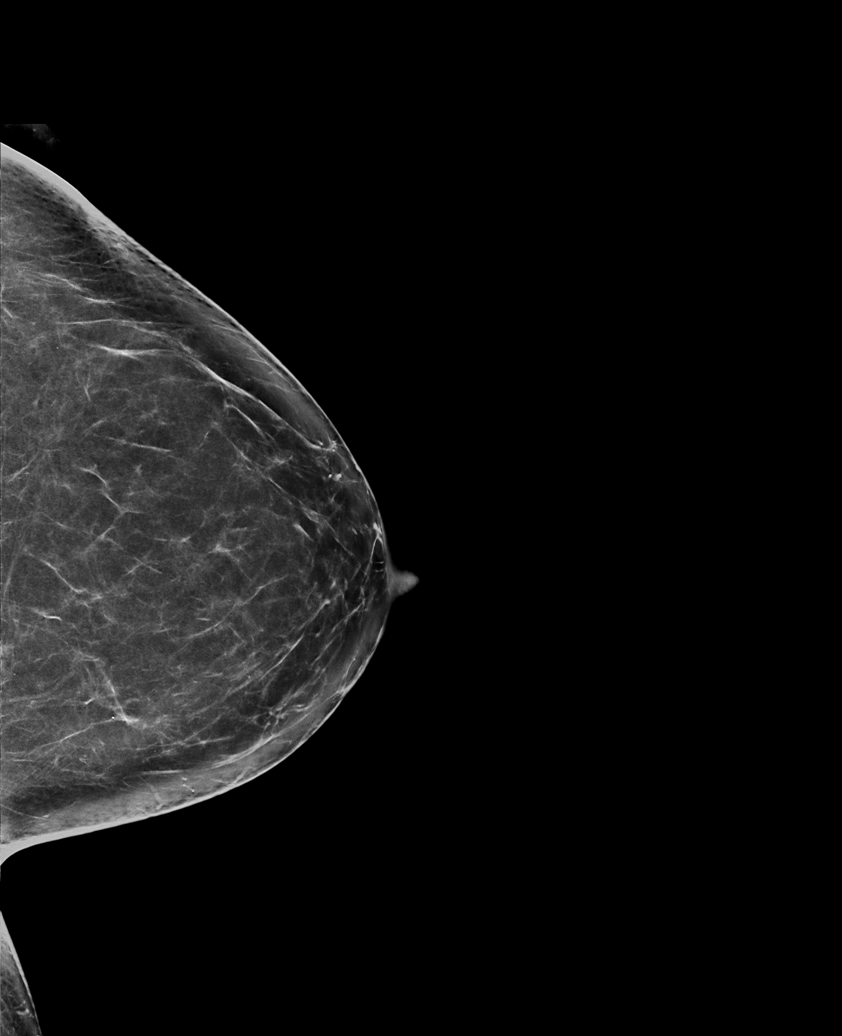

[R MLO synth-2D (2 of 2)]
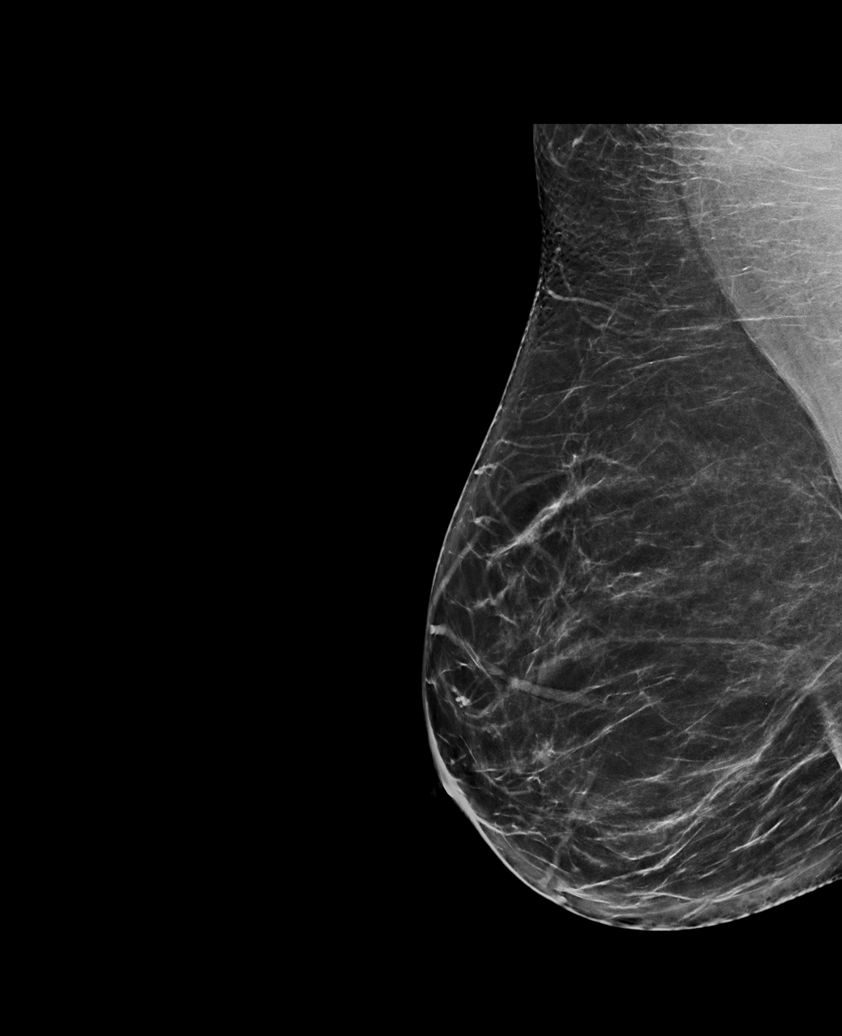

[R CC synth-2D]
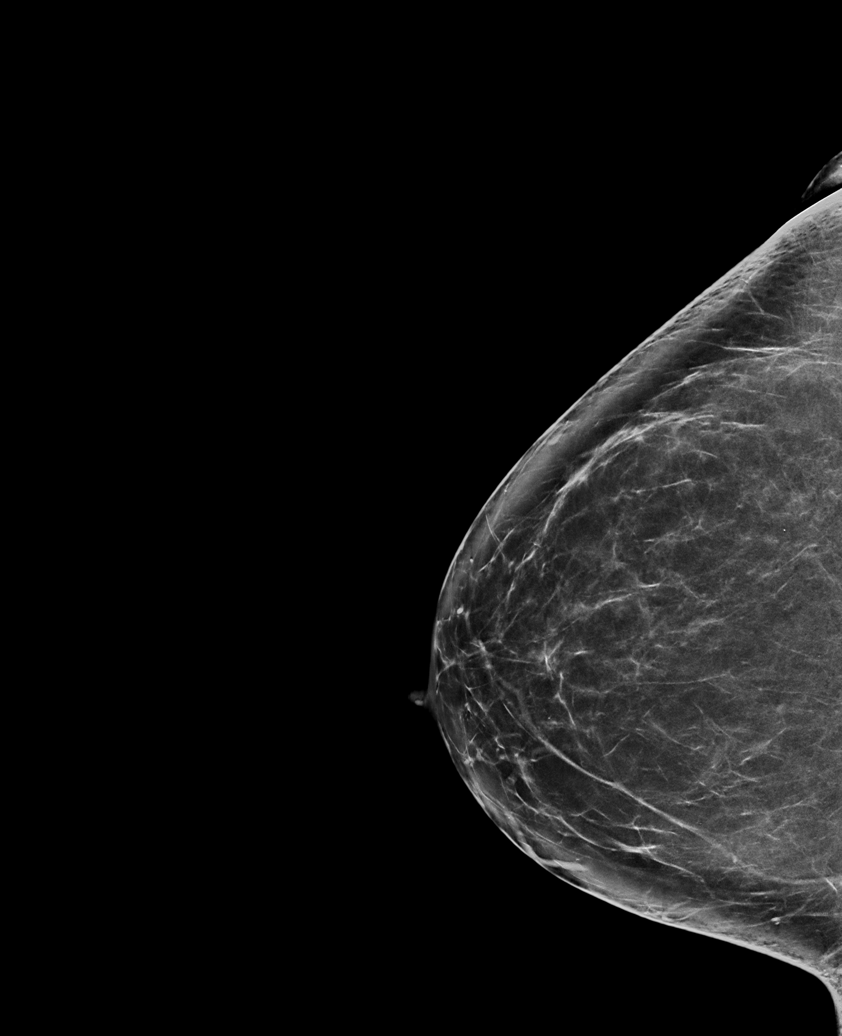

[6 of 36 positions shown; findings below may reference images not displayed]

ACR Breast Density Category b: There are scattered areas of
fibroglandular density.
FINDINGS: There are no findings suspicious for malignancy. The images were
evaluated with computer-aided detection.
IMPRESSION: No mammographic evidence of malignancy. A result letter of this
screening mammogram will be mailed directly to the patient.

RECOMMENDATION:
Screening mammogram in one year. (Code:[id number])

BI-RADS CATEGORY  1: Negative.

## 2021-07-21 ENCOUNTER — Telehealth: Payer: Self-pay | Admitting: Family Medicine

## 2021-07-21 NOTE — Telephone Encounter (Signed)
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 Placed in provider box

## 2021-07-22 NOTE — Telephone Encounter (Signed)
Pt last CPE 10/26/20. Paperwork put in Dr. Verda Cumins box.

## 2021-07-22 NOTE — Telephone Encounter (Signed)
We will need to get vaccine records for patient.   It also seems to require TB screening   Routing to MA to help get records and plan for nurse visit

## 2021-07-23 NOTE — Telephone Encounter (Signed)
Left VM notifying pt that we need her shot records and she will need a NV to get updated vaccines that are needed.

## 2021-07-23 NOTE — Telephone Encounter (Signed)
Pt returned my call and scheduled NV for 8/30 at 9:45. She will get TB and sign a record release form to request vaccination records from previous PCP.

## 2021-07-27 ENCOUNTER — Ambulatory Visit (INDEPENDENT_AMBULATORY_CARE_PROVIDER_SITE_OTHER): Payer: 59

## 2021-07-27 ENCOUNTER — Other Ambulatory Visit: Payer: Self-pay

## 2021-07-27 DIAGNOSIS — Z111 Encounter for screening for respiratory tuberculosis: Secondary | ICD-10-CM

## 2021-07-27 NOTE — Progress Notes (Signed)
Per orders of Dr. Einar Pheasant, PPD placed today on left lower arm. Daisean Brodhead V Markeshia Giebel. Patient tolerated injection well.

## 2021-07-29 LAB — TB SKIN TEST
Induration: 0 mm
TB Skin Test: NEGATIVE

## 2021-07-29 NOTE — Telephone Encounter (Signed)
Spoke w/patient. Assisted with locating immunizations in my chart. We do not have any record of TDAP. Her health maintenance shows TDAP out of date. She believes she received this thru her PCP. She has a call into them. She will contact us back to update our records on the tdap.

## 2021-08-24 ENCOUNTER — Telehealth: Payer: Self-pay | Admitting: Family Medicine

## 2021-08-24 NOTE — Telephone Encounter (Signed)
Form completed and signed. Pt notified that it is ready for pickup.

## 2021-08-24 NOTE — Telephone Encounter (Signed)
Pt call requesting a call back [phone] wants to know if fax was rercieve with medical records

## 2021-10-13 ENCOUNTER — Encounter: Payer: Self-pay | Admitting: Family Medicine

## 2021-10-13 ENCOUNTER — Ambulatory Visit (INDEPENDENT_AMBULATORY_CARE_PROVIDER_SITE_OTHER): Payer: 59 | Admitting: Family Medicine

## 2021-10-13 ENCOUNTER — Other Ambulatory Visit: Payer: Self-pay

## 2021-10-13 VITALS — BP 108/80 | HR 65 | Temp 97.9°F | Ht 67.0 in | Wt 210.4 lb

## 2021-10-13 DIAGNOSIS — H01005 Unspecified blepharitis left lower eyelid: Secondary | ICD-10-CM

## 2021-10-13 DIAGNOSIS — H029 Unspecified disorder of eyelid: Secondary | ICD-10-CM

## 2021-10-13 NOTE — Progress Notes (Signed)
Natalie Finnigan T. Greer Koeppen, MD, [place] [facility] [place], [address]  Phone: [phone]  FAX: [place] - 45 y.o. female  MRN [id number]  Date of Birth: 09-Jun-1976  Date: 10/13/2021  PCP: Lesleigh Noe, MD  Referral: Lesleigh Noe, MD  Chief Complaint  Patient presents with  . Bump on Lower Left eyelid    Has been there for a few months but is it starting to fill like it might be scratching her eye.    This visit occurred during the SARS-CoV-2 public health emergency.  Safety protocols were in place, including screening questions prior to the visit, additional usage of staff PPE, and extensive cleaning of exam room while observing appropriate contact time as indicated for disinfecting solutions.   Subjective:   Natalie Robinson is a 45 y.o. very pleasant female patient with Body mass index is 32.95 kg/m. who presents with the following:  Bump on L eye lid: This is been present for months.  It is somewhat scratchy and irritating.  It does not itch.  It never drained anything.  It is hard and does not change.  It is directly on the eyelid itself.  The patient's vision has not changed at all.  It does not bother her when it is light or sunny out.  She has never had any specific injury or trauma that she can recall.  She does not wear contacts at all.   Review of Systems is noted in the HPI, as appropriate  Objective:   BP 108/80   Pulse 65   Temp 97.9 F (36.6 C) (Temporal)   Ht 5\' 7"  (1.702 m)   Wt 210 lb 6 oz (95.4 kg)   SpO2 98%   BMI 32.95 kg/m   GEN: No acute distress; alert,appropriate. PULM: Breathing comfortably in no respiratory distress PSYCH: Normally interactive.   Pupils equal round reactive to light and accommodation and extraocular movements are intact. The conjunctive on the left is more injected than the right.  Patient's left eye was examined with a  black light after being dyed with fluorescein in the typical fashion after tetracaine.  No corneal abrasion or ulceration is seen.  Laboratory and Imaging Data:  Assessment and Plan:     ICD-10-CM   1. Lesion of left lower eyelid  H02.9 Ambulatory referral to Ophthalmology    2. Blepharitis of left lower eyelid, unspecified type  H01.005 Ambulatory referral to Ophthalmology     Lower eyelid lesion without evidence of corneal abrasion or ulcer.  I still think that she should see an eye doctor giving the length of time.  She may want to have this removed at this point.  Cautioned, avoid scratching if possible.  Orders Placed This Encounter  Procedures  . Ambulatory referral to Ophthalmology    Follow-up: None  Dragon [employer] One speech-to-text software was used for transcription in this dictation.  Possible transcriptional errors can occur using Editor, commissioning.   Signed,  Maud Deed. Shagun Wordell, MD   Outpatient Encounter Medications as of 10/13/2021  Medication Sig  . Ascorbic Acid (VITAMIN C) 500 MG CAPS Take 1,000 mg by mouth daily.  Marland Kitchen BIOTIN PO Take by mouth.  . Calcium Carbonate (CALCIUM 600 PO) Take 600 mg by mouth daily.  . ferrous sulfate 325 (65 FE) MG tablet Take by mouth.  . hydrOXYzine (VISTARIL) 25 MG capsule Take 1 capsule (25 mg total) by mouth 2 (two)  times daily as needed for itching.  Marland Kitchen ketoconazole (NIZORAL) 2 % shampoo Apply 1 application topically 2 (two) times a week.  . levocetirizine (XYZAL) 5 MG tablet Take by mouth.  . Multiple Vitamin (MULTI-VITAMIN) tablet Take by mouth.  . vitamin B-12 (CYANOCOBALAMIN) 100 MCG tablet Take 100 mcg by mouth daily.  Marland Kitchen VITAMIN D PO Take by mouth.  . [DISCONTINUED] benzonatate (TESSALON PERLES) 100 MG capsule Take 1 capsule (100 mg total) by mouth 3 (three) times daily as needed for cough.  . [DISCONTINUED] predniSONE (DELTASONE) 20 MG tablet Take two tablets daily for 4 days followed by one tablet daily for 4 days  .  [DISCONTINUED] traMADol (ULTRAM) 50 MG tablet Take 1 tablet (50 mg total) by mouth every 6 (six) hours as needed for severe pain.   No facility-administered encounter medications on file as of 10/13/2021.

## 2021-10-13 NOTE — Patient Instructions (Signed)
Blink or [place]

## 2021-10-25 ENCOUNTER — Encounter: Payer: Self-pay | Admitting: Obstetrics and Gynecology

## 2021-10-25 ENCOUNTER — Ambulatory Visit (INDEPENDENT_AMBULATORY_CARE_PROVIDER_SITE_OTHER): Payer: 59 | Admitting: Obstetrics and Gynecology

## 2021-10-25 ENCOUNTER — Other Ambulatory Visit: Payer: Self-pay

## 2021-10-25 VITALS — BP 120/70 | Ht 67.0 in | Wt 211.4 lb

## 2021-10-25 DIAGNOSIS — Z01419 Encounter for gynecological examination (general) (routine) without abnormal findings: Secondary | ICD-10-CM

## 2021-10-25 DIAGNOSIS — Z131 Encounter for screening for diabetes mellitus: Secondary | ICD-10-CM

## 2021-10-25 DIAGNOSIS — R5383 Other fatigue: Secondary | ICD-10-CM

## 2021-10-25 DIAGNOSIS — E041 Nontoxic single thyroid nodule: Secondary | ICD-10-CM | POA: Diagnosis not present

## 2021-10-25 DIAGNOSIS — Z130 Encounter for screening for diseases of the blood and blood-forming organs and certain disorders involving the immune mechanism: Secondary | ICD-10-CM

## 2021-10-25 DIAGNOSIS — Z1231 Encounter for screening mammogram for malignant neoplasm of breast: Secondary | ICD-10-CM

## 2021-10-25 DIAGNOSIS — Z13220 Encounter for screening for lipoid disorders: Secondary | ICD-10-CM

## 2021-10-25 NOTE — Progress Notes (Signed)
Gynecology Annual Exam  PCP: Lesleigh Noe, MD  Chief Complaint:  Chief Complaint  Patient presents with   Gynecologic Exam    History of Present Illness: Patient is a 45 y.o. [phone] presents for annual exam. The patient has no complaints today.   She reports that she has a history of a thyroid nodule that was being monitored on thyroid US. The nodule has been present since 2014. She had a nucleotide study that was normal in the past. She has not had a thyroid US since 2019 when she moved to the area from [place]. She sometimes feels it when she swallows. She would like to have another [place] to monitor the nodule. She reports her PCP has the records.   She is following with dermatology for history of skin cancer.   LMP: No LMP recorded. Patient has had an ablation. Average Interval: regular, 28 days Duration of flow: 3 days  Heavy Menses: no Dysmenorrhea: yes  She denies passage of large clots She denies sensations of gushing or flooding of blood. She denies accidents where she bleeds through her clothing. She denies that she changes a saturated pad or tampon more frequently than every hour.  She reports that pain from her periods limits her activities. She has found applying bengay cream to her legs helps with her menstrual cramps. Tramadol did not work to improve her pain so she has discontinued it. She has a history of an endometrial ablation and a tubal ligation. She is not interested in further intervention at this time. Discussed STIM device as another method of controlling pain.   The patient does perform self breast exams.  There is no notable family history of breast or ovarian cancer in her family.  The patient has regular exercise: walking 2-3 days a week  The patient denies current symptoms of depression.   PHQ-9: 2 GAD-7: 0   Review of Systems: Review of Systems  Constitutional:  Negative for chills, fever, malaise/fatigue and weight loss.  HENT:  Negative  for congestion, hearing loss and sinus pain.   Eyes:  Negative for blurred vision and double vision.  Respiratory:  Negative for cough, sputum production, shortness of breath and wheezing.   Cardiovascular:  Negative for chest pain, palpitations, orthopnea and leg swelling.  Gastrointestinal:  Negative for abdominal pain, constipation, diarrhea, nausea and vomiting.  Genitourinary:  Negative for dysuria, flank pain, frequency, hematuria and urgency.  Musculoskeletal:  Negative for back pain, falls and joint pain.  Skin:  Negative for itching and rash.  Neurological:  Negative for dizziness and headaches.  Endo/Heme/Allergies:  Positive for environmental allergies.  Psychiatric/Behavioral:  Negative for depression, substance abuse and suicidal ideas. The patient is not nervous/anxious.    Past Medical History:  Past Medical History:  Diagnosis Date   Allergy    Asthma    Frequent headaches    History of chicken pox    Migraines    Skin cancer 2017    Past Surgical History:  Past Surgical History:  Procedure Laterality Date   ABLATION  2017   CESAREAN SECTION  12/07/12,08/11/15   COLONOSCOPY WITH PROPOFOL N/A 01/25/2021   Procedure: COLONOSCOPY WITH PROPOFOL;  Surgeon: Lin Landsman, MD;  Location: [facility] ENDOSCOPY;  Service: Gastroenterology;  Laterality: N/A;   NASAL SINUS SURGERY     SKIN CANCER EXCISION     under nose   TUBAL LIGATION  2016    Gynecologic History:  No LMP recorded. Patient has had an  ablation. Menarche: 12  History of fibroids, polyps, or ovarian cysts? : no  History of PCOS? no Hstory of Endometriosis? no History of abnormal pap smears? no Have you had any sexually transmitted infections in the past?  no   Last Pap: Results were: 2020 NIL HPV neg   She identifies as a female. She is sexually active with men.   She denies dyspareunia. She denies postcoital bleeding.    Obstetric History: G2P1103  Family History:  Family History  Problem  Relation Age of Onset   Colon cancer Maternal Uncle 60   Colon cancer Maternal Uncle 46   Hyperlipidemia Mother    Hyperlipidemia Father    Arthritis Father    Cancer Father        skin caner   Stroke Maternal Grandmother    Hyperlipidemia Maternal Grandfather    Stroke Paternal Grandmother    Mental illness Sister    Breast cancer Neg Hx     Social History:  Social History   Socioeconomic History   Marital status: Married    Spouse name: Doctor, general practice   Number of children: 3   Years of education: College   Highest education level: Not on file  Occupational History   Not on file  Tobacco Use   Smoking status: Former    Packs/day: 0.10    Years: 10.00    Pack years: 1.00    Types: Cigarettes    Quit date: 11/29/2003    Years since quitting: 17.9   Smokeless tobacco: Never  Vaping Use   Vaping Use: Never used  Substance and Sexual Activity   Alcohol use: Yes    Comment: 1x per week, 1 serving   Drug use: Never   Sexual activity: Yes    Birth control/protection: Surgical    Comment: Ablation   Other Topics Concern   Not on file  Social History Narrative   10/21/19   From: [place]   Living: with Husband Public relations account executive), and 3 children   Work: has a Systems developer      Family: twins - Aiden and Sport and exercise psychologist (2014) and Thomasena Edis (2015)      Enjoys: spend time with family, reading       Exercise: burn boot camp - 3 times a week   Diet: trying to manage calories - low carb - bar for breakfast/shake for lunch, regular meal for dinner      Safety   Seat belts: Yes    Guns: Yes  and secure   Safe in relationships: Yes    Social Determinants of Health   Financial Resource Strain: Not on file  Food Insecurity: Not on file  Transportation Needs: Not on file  Physical Activity: Not on file  Stress: Not on file  Social Connections: Not on file  Intimate Partner Violence: Not on file    Allergies:  No Known Allergies  Medications: Prior to Admission medications    Medication Sig Start Date End Date Taking? Authorizing Provider  Ascorbic Acid (VITAMIN C) 500 MG CAPS Take 1,000 mg by mouth daily.   Yes [provider]  BIOTIN PO Take by mouth.   Yes [provider]  Calcium Carbonate (CALCIUM 600 PO) Take 600 mg by mouth daily.   Yes [provider]  ferrous sulfate 325 (65 FE) MG tablet Take by mouth.   Yes [provider]  hydrOXYzine (VISTARIL) 25 MG capsule Take 1 capsule (25 mg total) by mouth 2 (two) times daily as  needed for itching. 03/10/21  Yes Ria Bush, MD  levocetirizine (XYZAL) 5 MG tablet Take by mouth.   Yes [provider]  Multiple Vitamin (MULTI-VITAMIN) tablet Take by mouth.   Yes [provider]  vitamin B-12 (CYANOCOBALAMIN) 100 MCG tablet Take 100 mcg by mouth daily.   Yes [provider]  VITAMIN D PO Take by mouth.   Yes [provider]  ketoconazole (NIZORAL) 2 % shampoo Apply 1 application topically 2 (two) times a week. Patient not taking: Reported on 10/25/2021 01/27/20   [provider]    Physical Exam Vitals: Blood pressure 120/70, height 5\' 7"  (1.702 m), weight 211 lb 6.4 oz (95.9 kg).  Physical Exam Constitutional:      Appearance: She is well-developed.  Genitourinary:     Genitourinary Comments: External: Normal appearing vulva. No lesions noted.  Speculum examination: Normal appearing cervix. No blood in the vaginal vault. no discharge.   Bimanual examination: Uterus midline, non-tender, normal in size, shape and contour.  No CMT. No adnexal masses. No adnexal tenderness. Pelvis not fixed.  Breast Exam: breast equal without skin changes, nipple discharge, breast lump or enlarged lymph nodes   HENT:     Head: Normocephalic and atraumatic.  Neck:     Thyroid: No thyromegaly.  Cardiovascular:     Rate and Rhythm: Normal rate and regular rhythm.     Heart sounds: Normal heart sounds.  Pulmonary:     Effort: Pulmonary effort  is normal.     Breath sounds: Normal breath sounds.  Abdominal:     General: Bowel sounds are normal. There is no distension.     Palpations: Abdomen is soft. There is no mass.  Musculoskeletal:     Cervical back: Neck supple.  Neurological:     Mental Status: She is alert and oriented to person, place, and time.  Skin:    General: Skin is warm and dry.  Psychiatric:        Behavior: Behavior normal.        Thought Content: Thought content normal.        Judgment: Judgment normal.  Vitals reviewed.     Female chaperone present for pelvic and breast  portions of the physical exam  Assessment: 45 y.o. G2P1103 routine annual exam  Plan: Problem List Items Addressed This Visit       Endocrine   Thyroid nodule   Relevant Orders   TSH   T4, free   US THYROID   Other Visit Diagnoses     Encounter for annual routine gynecological examination    -  Primary   Relevant Orders   CBC With Differential   TSH   Comprehensive metabolic panel   Lipid panel   T4, free   US THYROID   Breast cancer screening by mammogram       Relevant Orders   MM 3D SCREEN BREAST BILATERAL   Screening for diabetes mellitus       Relevant Orders   Comprehensive metabolic panel   Screening cholesterol level       Relevant Orders   Lipid panel   Screening for deficiency anemia       Other fatigue       Relevant Orders   CBC With Differential       1) Mammogram - recommend yearly screening mammogram.  Mammogram was ordered today  2) STI screening was offered and declined  3) Pap smear in 2020 normal- plan for repeat pap in  2023  4) Contraception - tubal ligation.  5) Colonoscopy -- done in 2022, plan repeat in 7-10 years per report  6) Routine healthcare maintenance including cholesterol, diabetes screening discussed to return fasting at a later date. Orders placed.  7) Thyroid US ordered, patient will plan to follow up with PCP regarding the result.    Adrian Prows MD,  [facility], [facility] 10/25/2021 10:18 PM

## 2021-10-25 NOTE — Patient Instructions (Signed)
[employer] Recommended Dietary Allowances for Calcium and Vitamin D  Age (yr) Calcium Recommended Dietary Allowance (mg/day) Vitamin D Recommended Dietary Allowance (international units/day)  9-18 1,300 600  19-50 1,000 600  51-70 1,200 600  71 and older 1,200 800  Data from [employer]. Dietary reference intakes: calcium, vitamin D. [place], [place]: National Academies [employer]; 2011.   Exercising to Stay Healthy To become healthy and stay healthy, it is recommended that you do moderate-intensity and vigorous-intensity exercise. You can tell that you are exercising at a moderate intensity if your heart starts beating faster and you start breathing faster but can still hold a conversation. You can tell that you are exercising at a vigorous intensity if you are breathing much harder and faster and cannot hold a conversation while exercising. How can exercise benefit me? Exercising regularly is important. It has many health benefits, such as: Improving overall fitness, flexibility, and endurance. Increasing bone density. Helping with weight control. Decreasing body fat. Increasing muscle strength and endurance. Reducing stress and tension, anxiety, depression, or anger. Improving overall health. What guidelines should I follow while exercising? Before you start a new exercise program, talk with your health care provider. Do not exercise so much that you hurt yourself, feel dizzy, or get very short of breath. Wear comfortable clothes and wear shoes with good support. Drink plenty of water while you exercise to prevent dehydration or heat stroke. Work out until your breathing and your heartbeat get faster (moderate intensity). How often should I exercise? Choose an activity that you enjoy, and set realistic goals. Your health care provider can help you make an activity plan that is individually designed and works best for you. Exercise regularly as told by your health  care provider. This may include: Doing strength training two times a week, such as: Lifting weights. Using resistance bands. Push-ups. Sit-ups. Yoga. Doing a certain intensity of exercise for a given amount of time. Choose from these options: A total of 150 minutes of moderate-intensity exercise every week. A total of 75 minutes of vigorous-intensity exercise every week. A mix of moderate-intensity and vigorous-intensity exercise every week. Children, pregnant women, people who have not exercised regularly, people who are overweight, and older adults may need to talk with a health care provider about what activities are safe to perform. If you have a medical condition, be sure to talk with your health care provider before you start a new exercise program. What are some exercise ideas? Moderate-intensity exercise ideas include: Walking 1 mile (1.6 km) in about 15 minutes. Biking. Hiking. Golfing. Dancing. Water aerobics. Vigorous-intensity exercise ideas include: Walking 4.5 miles (7.2 km) or more in about 1 hour. Jogging or running 5 miles (8 km) in about 1 hour. Biking 10 miles (16.1 km) or more in about 1 hour. Lap swimming. Roller-skating or in-line skating. Cross-country skiing. Vigorous competitive sports, such as football, basketball, and soccer. Jumping rope. Aerobic dancing. What are some everyday activities that can help me get exercise? Yard work, such as: Pushing a lawn mower. Raking and bagging leaves. Washing your car. Pushing a stroller. Shoveling snow. Gardening. Washing windows or floors. How can I be more active in my day-to-day activities? Use stairs instead of an elevator. Take a walk during your lunch break. If you drive, park your car farther away from your work or school. If you take public transportation, get off one stop early and walk the rest of the way. Stand up or walk around during all of   your indoor phone calls. Get up, stretch, and walk  around every 30 minutes throughout the day. Enjoy exercise with a friend. Support to continue exercising will help you keep a regular routine of activity. Where to find more information You can find more information about exercising to stay healthy from: [place] Department of Health and Human Services: www.hhs.gov Centers for Disease Control and Prevention (CDC): www.cdc.gov Summary Exercising regularly is important. It will improve your overall fitness, flexibility, and endurance. Regular exercise will also improve your overall health. It can help you control your weight, reduce stress, and improve your bone density. Do not exercise so much that you hurt yourself, feel dizzy, or get very short of breath. Before you start a new exercise program, talk with your health care provider. This information is not intended to replace advice given to you by your health care provider. Make sure you discuss any questions you have with your health care provider. Document Revised: 03/12/2021 Document Reviewed: 03/12/2021 Elsevier Patient Education  2022 [employer]. Budget-Friendly Healthy Eating There are many ways to save money at the grocery store and continue to eat healthy. You can be successful if you: Plan meals according to your budget. Make a grocery list and only purchase food according to your grocery list. Prepare food yourself at home. What are tips for following this plan? Reading food labels Compare food labels between brand name foods and the store brand. Often the nutritional value is the same, but the store brand is lower cost. Look for products that do not have added sugar, fat, or salt (sodium). These often cost the same but are healthier for you. Products may be labeled as: Sugar-free. Nonfat. Low-fat. Sodium-free. Low-sodium. Look for lean ground beef labeled as at least 92% lean and 8% fat. Shopping  Buy only the items on your grocery list and go only to the areas of the store  that have the items on your list. Use coupons only for foods and brands you normally buy. Avoid buying items you wouldn't normally buy simply because they are on sale. Check online and in newspapers for weekly deals. Buy healthy items from the bulk bins when available, such as herbs, spices, flour, pasta, nuts, and dried fruit. Buy fruits and vegetables that are in season. Prices are usually lower on in-season produce. Look at the unit price on the price tag. Use it to compare different brands and sizes to find out which item is the best deal. Choose healthy items that are often low-cost, such as carrots, potatoes, apples, bananas, and oranges. Dried or canned beans are a low-cost protein source. Buy in bulk and freeze extra food. Items you can buy in bulk include meats, fish, poultry, frozen fruits, and frozen vegetables. Avoid buying "ready-to-eat" foods, such as pre-cut fruits and vegetables and pre-made salads. If possible, shop around to discover where you can find the best prices. Consider other retailers such as dollar stores, larger wholesale stores, local fruit and vegetable stands, and farmers markets. Do not shop when you are hungry. If you shop while hungry, it may be hard to stick to your list and budget. Resist impulse buying. Use your grocery list as your official plan for the week. Buy a variety of vegetables and fruits by purchasing fresh, frozen, and canned items. Look at the top and bottom shelves for deals. Foods at eye level (eye level of an adult or child) are usually more expensive. Be efficient with your time when shopping. The more time you   spend at the store, the more money you are likely to spend. To save money when choosing more expensive foods like meats and dairy: Choose cheaper cuts of meat, such as bone-in chicken thighs and drumsticks instead of skinless and boneless chicken. When you are ready to prepare the chicken, you can remove the skin yourself to make it  healthier. Choose lean meats like chicken or turkey instead of beef. Choose canned seafood, such as tuna, salmon, or sardines. Buy eggs as a low-cost source of protein. Buy dried beans and peas, such as lentils, split peas, or kidney beans instead of meats. Dried beans and peas are a good alternative source of protein. Buy the larger tubs of yogurt instead of individual-sized containers. Choose water instead of sodas and other sweetened beverages. Avoid buying chips, cookies, and other "junk food." These items are usually expensive and not healthy. Cooking Make extra food and freeze the extras in meal-sized containers or in individual portions for fast meals and snacks. Pre-cook on days when you have extra time to prepare meals in advance. You can keep these meals in the fridge or freezer and reheat for a quick meal. When you come home from the grocery store, wash, peel, and cut fruits and vegetables so they are ready to use and eat. This will help reduce food waste. Meal planning Do not eat out or get fast food. Prepare food at home. Make a grocery list and make sure to bring it with you to the store. If you have a smart phone, you could use your phone to create your shopping list. Plan meals and snacks according to a grocery list and budget you create. Use leftovers in your meal plan for the week. Look for recipes where you can cook once and make enough food for two meals. Prepare budget-friendly types of meals like stews, casseroles, and stir-fry dishes. Try some meatless meals or try "no cook" meals like salads. Make sure that half your plate is filled with fruits or vegetables. Choose from fresh, frozen, or canned fruits and vegetables. If eating canned, remember to rinse them before eating. This will remove any excess salt added for packaging. Summary Eating healthy on a budget is possible if you plan your meals according to your budget, purchase according to your budget and grocery list,  and prepare food yourself. Tips for buying more food on a limited budget include buying generic brands, using coupons only for foods you normally buy, and buying healthy items from the bulk bins when available. Tips for buying cheaper food to replace expensive food include choosing cheaper, lean cuts of meat, and buying dried beans and peas. This information is not intended to replace advice given to you by your health care provider. Make sure you discuss any questions you have with your health care provider. Document Revised: 08/27/2020 Document Reviewed: 08/27/2020 Elsevier Patient Education  2022 [employer]. Bone Health Bones protect organs, store calcium, anchor muscles, and support the whole body. Keeping your bones strong is important, especially as you get older. You can take actions to help keep your bones strong and healthy. Why is keeping my bones healthy important? Keeping your bones healthy is important because your body constantly replaces bone cells. Cells get old, and new cells take their place. As we age, we lose bone cells because the body may not be able to make enough new cells to replace the old cells. The amount of bone cells and bone tissue you have is referred to as   bone mass. The higher your bone mass, the stronger your bones. The aging process leads to an overall loss of bone mass in the body, which can increase the likelihood of: Broken bones. A condition in which the bones become weak and brittle (osteoporosis). A large decline in bone mass occurs in older adults. In women, it occurs about the time of menopause. What actions can I take to keep my bones healthy? Good health habits are important for maintaining healthy bones. This includes eating nutritious foods and exercising regularly. To have healthy bones, you need to get enough of the right minerals and vitamins. Most nutrition experts recommend getting these nutrients from the foods that you eat. In some cases,  taking supplements may also be recommended. Doing certain types of exercise is also important for bone health. What are the nutritional recommendations for healthy bones? Eating a well-balanced diet with plenty of calcium and vitamin D will help to protect your bones. Nutritional recommendations vary from person to person. Ask your health care provider what is healthy for you. Here are some general guidelines. Get enough calcium Calcium is the most important (essential) mineral for bone health. Most people can get enough calcium from their diet, but supplements may be recommended for people who are at risk for osteoporosis. Good sources of calcium include: Dairy products, such as low-fat or nonfat milk, cheese, and yogurt. Dark green leafy vegetables, such as bok choy and broccoli. Foods that have calcium added to them (are fortified). Foods that may be fortified with calcium include orange juice, cereal, bread, soy beverages, and tofu products. Nuts, such as almonds. Follow these recommended amounts for daily calcium intake: Infants, 0-6 months: 200 mg. Infants, 6-12 months: 260 mg. Children, age 1-3: 700 mg. Children, age 4-8: 1,000 mg. Children, age 9-13: 1,300 mg. Teens, age 14-18: 1,300 mg. Adults, age 19-50: 1,000 mg. Adults, age 51-70: Men: 1,000 mg. Women: 1,200 mg. Adults, age 71 or older: 1,200 mg. Pregnant and breastfeeding females: Teens: 1,300 mg. Adults: 1,000 mg. Get enough vitamin D Vitamin D is the most essential vitamin for bone health. It helps the body absorb calcium. Sunlight stimulates the skin to make vitamin D, so be sure to get enough sunlight. If you live in a cold climate or you do not get outside often, your health care provider may recommend that you take vitamin D supplements. Good sources of vitamin D in your diet include: Egg yolks. Saltwater fish. Milk and cereal fortified with vitamin D. Follow these recommended amounts for daily vitamin D  intake: Infants, 0-12 months: 400 international units (IU). Children and teens, age 1-18: 600 international units. Adults, age 59 or younger: 600 international units. Adults, age 60 or older: 600-1,000 international units. Get other important nutrients Other nutrients that are important for bone health include: Phosphorus. This mineral is found in meat, poultry, dairy foods, nuts, and legumes. The recommended daily intake for adult men and adult women is 700 mg. Magnesium. This mineral is found in seeds, nuts, dark green vegetables, and legumes. The recommended daily intake for adult men is 400-420 mg. For adult women, it is 310-320 mg. Vitamin K. This vitamin is found in green leafy vegetables. The recommended daily intake is 120 mcg for adult men and 90 mcg for adult women. What type of physical activity is best for building and maintaining healthy bones? Weight-bearing and strength-building activities are important for building and maintaining healthy bones. Weight-bearing activities cause muscles and bones to work against gravity. Strength-building activities   increase the strength of the muscles that support bones. Weight-bearing and muscle-building activities include: Walking and hiking. Jogging and running. Dancing. Gym exercises. Lifting weights. Tennis and racquetball. Climbing stairs. Aerobics. Adults should get at least 30 minutes of moderate physical activity on most days. Children should get at least 60 minutes of moderate physical activity on most days. Ask your health care provider what type of exercise is best for you. How can I find out if my bone mass is low? Bone mass can be measured with an X-ray test called a bone mineral density (BMD) test. This test is recommended for all women who are age 65 or older. It may also be recommended for: Men who are age 70 or older. People who are at risk for osteoporosis because of: Having a long-term disease that weakens bones, such as  kidney disease or rheumatoid arthritis. Having menopause earlier than normal. Taking medicine that weakens bones, such as steroids, thyroid hormones, or hormone treatment for breast cancer or prostate cancer. Smoking. Drinking three or more alcoholic drinks a day. Being underweight. Sedentary lifestyle. If you find that you have a low bone mass, you may be able to prevent osteoporosis or further bone loss by changing your diet and lifestyle. Where can I find more information? Bone Health & Osteoporosis Foundation: www.nof.org/patients National Institutes of Health: www.bones.nih.gov International Osteoporosis Foundation: www.iofbonehealth.org Summary The aging process leads to an overall loss of bone mass in the body, which can increase the likelihood of broken bones and osteoporosis. Eating a well-balanced diet with plenty of calcium and vitamin D will help to protect your bones. Weight-bearing and strength-building activities are also important for building and maintaining strong bones. Bone mass can be measured with an X-ray test called a bone mineral density (BMD) test. This information is not intended to replace advice given to you by your health care provider. Make sure you discuss any questions you have with your health care provider. Document Revised: 04/28/2021 Document Reviewed: 04/28/2021 Elsevier Patient Education  2022 [employer] Inc.  

## 2021-10-27 ENCOUNTER — Other Ambulatory Visit: Payer: 59

## 2021-10-27 ENCOUNTER — Encounter: Payer: 59 | Admitting: Family Medicine

## 2021-10-27 ENCOUNTER — Other Ambulatory Visit: Payer: Self-pay

## 2021-10-27 DIAGNOSIS — Z131 Encounter for screening for diabetes mellitus: Secondary | ICD-10-CM

## 2021-10-27 DIAGNOSIS — Z01419 Encounter for gynecological examination (general) (routine) without abnormal findings: Secondary | ICD-10-CM

## 2021-10-27 DIAGNOSIS — R5383 Other fatigue: Secondary | ICD-10-CM

## 2021-10-27 DIAGNOSIS — E041 Nontoxic single thyroid nodule: Secondary | ICD-10-CM

## 2021-10-27 DIAGNOSIS — Z13220 Encounter for screening for lipoid disorders: Secondary | ICD-10-CM

## 2021-10-28 LAB — CBC WITH DIFFERENTIAL
Basophils Absolute: 0.1 10*3/uL (ref 0.0–0.2)
Basos: 1 %
EOS (ABSOLUTE): 0.2 10*3/uL (ref 0.0–0.4)
Eos: 2 %
Hematocrit: 41.2 % (ref 34.0–46.6)
Hemoglobin: 14 g/dL (ref 11.1–15.9)
Immature Grans (Abs): 0.1 10*3/uL (ref 0.0–0.1)
Immature Granulocytes: 1 %
Lymphocytes Absolute: 1.9 10*3/uL (ref 0.7–3.1)
Lymphs: 31 %
MCH: 31.7 pg (ref 26.6–33.0)
MCHC: 34 g/dL (ref 31.5–35.7)
MCV: 93 fL (ref 79–97)
Monocytes Absolute: 0.5 10*3/uL (ref 0.1–0.9)
Monocytes: 8 %
Neutrophils Absolute: 3.6 10*3/uL (ref 1.4–7.0)
Neutrophils: 57 %
RBC: 4.41 x10E6/uL (ref 3.77–5.28)
RDW: 12.2 % (ref 11.7–15.4)
WBC: 6.3 10*3/uL (ref 3.4–10.8)

## 2021-10-28 LAB — COMPREHENSIVE METABOLIC PANEL
ALT: 16 IU/L (ref 0–32)
AST: 12 IU/L (ref 0–40)
Albumin/Globulin Ratio: 2.5 — ABNORMAL HIGH (ref 1.2–2.2)
Albumin: 4.5 g/dL (ref 3.8–4.8)
Alkaline Phosphatase: 67 IU/L (ref 44–121)
BUN/Creatinine Ratio: 20 (ref 9–23)
BUN: 17 mg/dL (ref 6–24)
Bilirubin Total: 0.4 mg/dL (ref 0.0–1.2)
CO2: 25 mmol/L (ref 20–29)
Calcium: 9.7 mg/dL (ref 8.7–10.2)
Chloride: 102 mmol/L (ref 96–106)
Creatinine, Ser: 0.83 mg/dL (ref 0.57–1.00)
Globulin, Total: 1.8 g/dL (ref 1.5–4.5)
Glucose: 85 mg/dL (ref 70–99)
Potassium: 4.6 mmol/L (ref 3.5–5.2)
Sodium: 141 mmol/L (ref 134–144)
Total Protein: 6.3 g/dL (ref 6.0–8.5)
eGFR: 89 mL/min/{1.73_m2} (ref >59)

## 2021-10-28 LAB — LIPID PANEL
Chol/HDL Ratio: 4.6 ratio — ABNORMAL HIGH (ref 0.0–4.4)
Cholesterol, Total: 207 mg/dL — ABNORMAL HIGH (ref 100–199)
HDL: 45 mg/dL (ref >39)
LDL Chol Calc (NIH): 131 mg/dL — ABNORMAL HIGH (ref 0–99)
Triglycerides: 174 mg/dL — ABNORMAL HIGH (ref 0–149)
VLDL Cholesterol Cal: 31 mg/dL (ref 5–40)

## 2021-10-28 LAB — T4, FREE: Free T4: 1.18 ng/dL (ref 0.82–1.77)

## 2021-10-28 LAB — TSH: TSH: 2.37 u[IU]/mL (ref 0.450–4.500)

## 2021-10-29 ENCOUNTER — Encounter: Payer: Self-pay | Admitting: *Deleted

## 2021-11-04 ENCOUNTER — Ambulatory Visit
Admission: RE | Admit: 2021-11-04 | Discharge: 2021-11-04 | Disposition: A | Discharge status: Home / Self Care | Payer: 59 | Source: Ambulatory Visit | Attending: Obstetrics and Gynecology | Admitting: Obstetrics and Gynecology

## 2021-11-04 ENCOUNTER — Other Ambulatory Visit: Payer: Self-pay

## 2021-11-04 DIAGNOSIS — Z01419 Encounter for gynecological examination (general) (routine) without abnormal findings: Secondary | ICD-10-CM

## 2021-11-04 DIAGNOSIS — E041 Nontoxic single thyroid nodule: Secondary | ICD-10-CM | POA: Insufficient documentation

## 2021-11-12 ENCOUNTER — Ambulatory Visit: Payer: 59 | Admitting: Obstetrics and Gynecology

## 2022-01-24 ENCOUNTER — Ambulatory Visit (INDEPENDENT_AMBULATORY_CARE_PROVIDER_SITE_OTHER): Payer: Managed Care, Other (non HMO) | Admitting: Family Medicine

## 2022-01-24 ENCOUNTER — Other Ambulatory Visit: Payer: Self-pay

## 2022-01-24 VITALS — BP 120/80 | HR 66 | Temp 97.9°F | Ht 67.5 in | Wt 212.2 lb

## 2022-01-24 DIAGNOSIS — Z1159 Encounter for screening for other viral diseases: Secondary | ICD-10-CM | POA: Diagnosis not present

## 2022-01-24 DIAGNOSIS — J019 Acute sinusitis, unspecified: Secondary | ICD-10-CM

## 2022-01-24 DIAGNOSIS — Z0000 Encounter for general adult medical examination without abnormal findings: Secondary | ICD-10-CM | POA: Diagnosis not present

## 2022-01-24 NOTE — Progress Notes (Signed)
Annual Exam   Chief Complaint:  Chief Complaint  Patient presents with   Annual Exam   Sinusitis    X 1 week     History of Present Illness:  Ms. Natalie Robinson is a 46 y.o. [id number] who LMP was No LMP recorded. Patient has had an ablation., presents today for her annual examination.    #Sinusitis - sinus Ha - above the eye - head - weather changing - went trail riding  - runny nose - some ear pain - no dental pain - no fevers - since last Wednesday - tylenol sinus   Nutrition Diet: horrible - not great - planning to make a change, stress eating Exercise: walking She does get adequate calcium and Vitamin D in her diet.   Social History   Tobacco Use  Smoking Status Former   Packs/day: 0.10   Years: 10.00   Pack years: 1.00   Types: Cigarettes   Quit date: 11/29/2003   Years since quitting: 18.1  Smokeless Tobacco Never   Social History   Substance and Sexual Activity  Alcohol Use Yes   Comment: 1x per week, 1 serving   Social History   Substance and Sexual Activity  Drug Use Never    Safety The patient wears seatbelts: yes.     The patient feels safe at home and in their relationships: yes.  General Health Dentist in the last year: Yes Eye doctor: yes  Menstrual Her menses are spotting and cramping s/p ablation  GYN She is single partner, contraception - ablation.    Cervical Cancer Screening:   Last Pap:   November 2020 Results were: no abnormalities /neg HPV DNA    Breast Cancer Screening There is no FH of breast cancer. There is no FH of ovarian cancer. BRCA screening Not Indicated.  Discussed that for average risk women between age 34-49 screening may reduce the risk of breast cancer death, however, at a lower rate than those over age 36. And that the the false-positive rates resulting in unnecessary biopsies with more screening is higher. The balance of benefits vs harms likely improves as you progress through your 40s. The patient  does want a mammogram this year.   Colon Cancer Screening:  Age 33-75 yo - benefits outweigh the risk. Adults 31-85 yo who have never been screened benefit.  Benefits: 134000 people in 2016 will be diagnosed and 49,000 will die - early detection helps Harms: Complications 2/2 to colonoscopy High Risk (Colonoscopy): genetic disorder (Lynch syndrome or familial adenomatous polyposis), personal hx of IBD, previous adenomatous polyp, or previous colorectal cancer, FamHx start 10 years before the age at diagnosis, increased in males and black race  Options:  FIT - looks for hemoglobin (blood in the stool) - specific and fairly sensitive - must be done annually Cologuard - looks for DNA and blood - more sensitive - therefore can have more false positives, every 3 years Colonoscopy - every 10 years if normal - sedation, bowl prep, must have someone drive you  Shared decision making and the patient had decided to do colonoscopy.  Weight Wt Readings from Last 3 Encounters:  01/24/22 212 lb 3 oz (96.2 kg)  10/25/21 211 lb 6.4 oz (95.9 kg)  10/13/21 210 lb 6 oz (95.4 kg)   Patient has high BMI  BMI Readings from Last 1 Encounters:  01/24/22 32.74 kg/m     Chronic disease screening Blood pressure monitoring:  BP Readings from Last 3 Encounters:  01/24/22 120/80  10/25/21 120/70  10/13/21 108/80    Lipid Monitoring: Indication for screening: age >53, obesity, diabetes, family hx, CV risk factors.  Lipid screening: Yes  Lab Results  Component Value Date   CHOL 207 (H) 10/27/2021   HDL 45 10/27/2021   LDLCALC 131 (H) 10/27/2021   TRIG 174 (H) 10/27/2021   CHOLHDL 4.6 (H) 10/27/2021     Diabetes Screening: age >11, overweight, family hx, PCOS, hx of gestational diabetes, at risk ethnicity Diabetes Screening screening: Yes  No results found for: HGBA1C   Past Medical History:  Diagnosis Date   Allergy    Asthma    Frequent headaches    History of chicken pox    Migraines     Skin cancer 2017    Past Surgical History:  Procedure Laterality Date   ABLATION  2017   CESAREAN SECTION  12/07/12,08/11/15   COLONOSCOPY WITH PROPOFOL N/A 01/25/2021   Procedure: COLONOSCOPY WITH PROPOFOL;  Surgeon: Lin Landsman, MD;  Location: [facility] END[facility]COPY;  Service: Gastroenterology;  Laterality: N/A;   NASAL SINUS SURGERY     SKIN CANCER EXCISION     under nose   TUBAL LIGATION  2016    Prior to Admission medications   Medication Sig Start Date End Date Taking? Authorizing Provider  Ascorbic Acid (VITAMIN C) 500 MG CAPS Take 1,000 mg by mouth daily.   Yes [provider]  BIOTIN PO Take by mouth.   Yes [provider]  Calcium Carbonate (CALCIUM 600 PO) Take 600 mg by mouth daily.   Yes [provider]  ferrous sulfate 325 (65 FE) MG tablet Take by mouth.   Yes [provider]  hydrOXYzine (VISTARIL) 25 MG capsule Take 1 capsule (25 mg total) by mouth 2 (two) times daily as needed for itching. 03/10/21  Yes Ria Bush, MD  levocetirizine (XYZAL) 5 MG tablet Take by mouth.   Yes [provider]  Multiple Vitamin (MULTI-VITAMIN) tablet Take by mouth.   Yes [provider]  vitamin B-12 (CYANOCOBALAMIN) 100 MCG tablet Take 100 mcg by mouth daily.   Yes [provider]  VITAMIN D PO Take by mouth.   Yes [provider]    No Known Allergies  Gynecologic History: No LMP recorded. Patient has had an ablation.  Obstetric History: G2P1103  Social History   Socioeconomic History   Marital status: Married    Spouse name: Elta Guadeloupe   Number of children: 3   Years of education: College   Highest education level: Not on file  Occupational History   Not on file  Tobacco Use   Smoking status: Former    Packs/day: 0.10    Years: 10.00    Pack years: 1.00    Types: Cigarettes    Quit date: 11/29/2003    Years since quitting: 18.1   Smokeless tobacco: Never  Vaping Use   Vaping Use: Never used   Substance and Sexual Activity   Alcohol use: Yes    Comment: 1x per week, 1 serving   Drug use: Never   Sexual activity: Yes    Birth control/protection: Surgical    Comment: Ablation   Other Topics Concern   Not on file  Social History Narrative   10/21/19   From: [place]: with Husband Elta Gu[place]), and 3 children   Work: has a Systems developer      Family: twins - Aiden and Sport and exercise psychologist (2014) and Patent examiner [phone])  Enjoys: spend time with family, reading       Exercise: burn boot camp - 3 times a week   Diet: trying to manage calories - low carb - bar for breakfast/shake for lunch, regular meal for dinner      Safety   Seat belts: Yes    Guns: Yes  and secure   Safe in relationships: Yes    Social Determinants of Health   Financial Resource Strain: Not on file  Food Insecurity: Not on file  Transportation Needs: Not on file  Physical Activity: Not on file  Stress: Not on file  Social Connections: Not on file  Intimate Partner Violence: Not on file    Family History  Problem Relation Age of Onset   Colon cancer Maternal Uncle 60   Colon cancer Maternal Uncle 16   Hyperlipidemia Mother    Hyperlipidemia Father    Arthritis Father    Cancer Father        skin caner   Stroke Maternal Grandmother    Hyperlipidemia Maternal Grandfather    Stroke Paternal Grandmother    Mental illness Sister    Breast cancer Neg Hx     Review of Systems  Constitutional:  Negative for chills and fever.  HENT:  Positive for congestion, ear pain and sinus pain. Negative for sore throat.   Eyes:  Negative for blurred vision and double vision.  Respiratory:  Negative for shortness of breath.   Cardiovascular:  Negative for chest pain.  Gastrointestinal:  Negative for heartburn, nausea and vomiting.  Genitourinary: Negative.   Musculoskeletal: Negative.  Negative for myalgias.  Skin:  Negative for rash.  Neurological:  Negative for dizziness and headaches.   Endo/Heme/Allergies:  Does not bruise/bleed easily.  Psychiatric/Behavioral:  Negative for depression. The patient is not nervous/anxious.     Physical Exam BP 120/80    Pulse 66    Temp 97.9 F (36.6 C) (Oral)    Ht 5' 7.5" (1.715 m)    Wt 212 lb 3 oz (96.2 kg)    SpO2 100%    BMI 32.74 kg/m    BP Readings from Last 3 Encounters:  01/24/22 120/80  10/25/21 120/70  10/13/21 108/80      Physical Exam Constitutional:      General: She is not in acute distress.    Appearance: She is well-developed. She is not diaphoretic.  HENT:     Head: Normocephalic and atraumatic.     Right Ear: Tympanic membrane and ear canal normal.     Left Ear: Tympanic membrane and ear canal normal.     Nose: Mucosal edema and rhinorrhea present.     Right Sinus: No maxillary sinus tenderness or frontal sinus tenderness.     Left Sinus: No maxillary sinus tenderness or frontal sinus tenderness.     Mouth/Throat:     Pharynx: Uvula midline. Posterior oropharyngeal erythema present. No oropharyngeal exudate.     Tonsils: 0 on the right. 0 on the left.  Eyes:     General: No scleral icterus.    Conjunctiva/sclera: Conjunctivae normal.  Cardiovascular:     Rate and Rhythm: Normal rate and regular rhythm.     Heart sounds: Normal heart sounds. No murmur heard. Pulmonary:     Effort: Pulmonary effort is normal. No respiratory distress.     Breath sounds: Normal breath sounds.  Abdominal:     General: Abdomen is flat. Bowel sounds are normal. There is no distension.  Palpations: Abdomen is soft.     Tenderness: There is no abdominal tenderness. There is no guarding or rebound.  Musculoskeletal:        General: Normal range of motion.     Cervical back: Neck supple.  Lymphadenopathy:     Cervical: No cervical adenopathy.  Skin:    General: Skin is warm and dry.     Capillary Refill: Capillary refill takes less than 2 seconds.  Neurological:     General: No focal deficit present.     Mental  Status: She is alert.  Psychiatric:        Mood and Affect: Mood normal.     Results:  PHQ-9:  [place] Office Visit from 10/25/2021 in [facility]  PHQ-9 Total Score 2         Assessment: 46 y.o. [phone] female here for routine annual physical examination.  Plan: Problem List Items Addressed This Visit   None Visit Diagnoses     Annual physical exam    -  Primary   Need for hepatitis C screening test       Relevant Orders   Hepatitis C Antibody   Acute non-recurrent sinusitis, unspecified location           Screening: -- Blood pressure screen normal -- cholesterol screening: will obtain -- Weight screening: overweight: continue to monitor -- Diabetes Screening: not due for screening -- Nutrition: Encouraged healthy diet  The 10-year ASCVD risk score (Arnett DK, et al., 2019) is: 1.1%   Values used to calculate the score:     Age: 20 years     Sex: Female     Is Non-Hispanic African American: No     Diabetic: No     Tobacco smoker: No     Systolic Blood Pressure: 101 mmHg     Is BP treated: No     HDL Cholesterol: 45 mg/dL     Total Cholesterol: 207 mg/dL  -- Statin therapy for Age 42-75 with CVD risk >7.5%  Psych -- Depression screening (PHQ-9):  [place] Office Visit from 10/25/2021 in [facility]  PHQ-9 Total Score 2        Safety -- tobacco screening: not using -- alcohol screening: low-risk usage.    Cancer Screening -- pap smear not collected per ASCCP guidelines -- family history of breast cancer screening: done. not at high risk. -- Mammogram -  up to date -- Colon cancer (age 71+)--  up to date  Immunizations Immunization History  Administered Date(s) Administered   Influenza,inj,Quad PF,6+ Mos 08/14/2019, 10/07/2020   Influenza-Unspecified 10/11/2021   PFIZER(Purple Top)SARS-COV-2 Vaccination 02/23/2020, 03/15/2020   PPD Test 07/27/2021   Tdap 12/21/2012    -- flu vaccine up to date -- TDAP  q10 years up to date -- Covid-19 Vaccine up to date  Suspect sinus symptoms are secondary to allergies given some outdoor activity.  Discussed symptomatic treatment and follow-up if no improvement or worsening over the next few days.   Encouraged healthy diet and exercise. Encouraged regular vision and dental care.   Lesleigh Noe, MD

## 2022-01-24 NOTE — Patient Instructions (Signed)
°  Sinus Congestion 1) Neti Pot (Saline rinse) -- 2 times day -- if tolerated 2) Flonase (Store Brand ok) - once daily 3) Over the counter congestion medications  Call if not improved on Friday to consider antibiotics

## 2022-02-23 ENCOUNTER — Other Ambulatory Visit: Payer: Self-pay

## 2022-02-23 ENCOUNTER — Ambulatory Visit
Admission: RE | Admit: 2022-02-23 | Discharge: 2022-02-23 | Disposition: A | Discharge status: Home / Self Care | Payer: Managed Care, Other (non HMO) | Source: Ambulatory Visit | Attending: Obstetrics and Gynecology | Admitting: Obstetrics and Gynecology

## 2022-02-23 DIAGNOSIS — Z1231 Encounter for screening mammogram for malignant neoplasm of breast: Secondary | ICD-10-CM | POA: Diagnosis not present

## 2022-05-04 ENCOUNTER — Emergency Department: Payer: Commercial Managed Care - HMO

## 2022-05-04 ENCOUNTER — Emergency Department
Admission: EM | Admit: 2022-05-04 | Discharge: 2022-05-04 | Disposition: A | Discharge status: Home / Self Care | Payer: Commercial Managed Care - HMO | Attending: Emergency Medicine | Admitting: Emergency Medicine

## 2022-05-04 ENCOUNTER — Encounter: Payer: Self-pay | Admitting: Medical Oncology

## 2022-05-04 DIAGNOSIS — S93401A Sprain of unspecified ligament of right ankle, initial encounter: Secondary | ICD-10-CM | POA: Insufficient documentation

## 2022-05-04 DIAGNOSIS — S99911A Unspecified injury of right ankle, initial encounter: Secondary | ICD-10-CM | POA: Diagnosis present

## 2022-05-04 DIAGNOSIS — Z85828 Personal history of other malignant neoplasm of skin: Secondary | ICD-10-CM | POA: Insufficient documentation

## 2022-05-04 DIAGNOSIS — X500XXA Overexertion from strenuous movement or load, initial encounter: Secondary | ICD-10-CM | POA: Diagnosis not present

## 2022-05-04 DIAGNOSIS — J45909 Unspecified asthma, uncomplicated: Secondary | ICD-10-CM | POA: Insufficient documentation

## 2022-05-04 DIAGNOSIS — M25471 Effusion, right ankle: Secondary | ICD-10-CM

## 2022-05-04 NOTE — ED Triage Notes (Signed)
Pt reports rt ankle pain/injury yesterday with swelling.

## 2022-05-04 NOTE — ED Provider Notes (Signed)
[facility] Provider Note    None    (approximate)   History   Ankle Pain   HPI  Natalie Robinson is a 46 y.o. female presenting to the emergency department for treatment and evaluation of right ankle pain.  She twisted yesterday while helping put up hay. No previous injury or surgery. No relief with OTC meds.  Past Medical History:  Diagnosis Date   Allergy    Asthma    Frequent headaches    History of chicken pox    Migraines    Skin cancer 2017        Physical Exam   Triage Vital Signs: ED Triage Vitals  Enc Vitals Group     BP 05/04/22 0837 126/84     Pulse Rate 05/04/22 0837 71     Resp 05/04/22 0837 16     Temp 05/04/22 0837 97.8 F (36.6 C)     Temp Source 05/04/22 0837 Oral     SpO2 05/04/22 0837 99 %     Weight 05/04/22 0834 200 lb (90.7 kg)     Height 05/04/22 0834 '5\' 7"'$  (1.702 m)     Head Circumference --      Peak Flow --      Pain Score 05/04/22 0834 7     Pain Loc --      Pain Edu? --      Excl. in [place]? --     Most recent vital signs: Vitals:   05/04/22 0837  BP: 126/84  Pulse: 71  Resp: 16  Temp: 97.8 F (36.6 C)  SpO2: 99%    General: Awake, no distress.  CV:  Good peripheral perfusion.  Resp:  Normal effort.  Abd:  No distention.  Other:  Diffuse edema over the right ankle.  No obvious deformity.  Compartment is soft.  No tenderness over the proximal fibula.   ED Results / Procedures / Treatments   Labs (all labs ordered are listed, but only abnormal results are displayed) Labs Reviewed - No data to display   EKG  Not indicated   RADIOLOGY  Image of the right ankle shows no acute bony abnormality. Radiologist notes a suspected ankle joint effusion but no associated fracture or dislocation.  I have independently reviewed and interpreted imaging as well as reviewed report from radiology.  PROCEDURES:  Critical Care performed: No  Procedures   MEDICATIONS ORDERED IN  ED:  Medications - No data to display   IMPRESSION / MDM / [place] / ED COURSE   I reviewed the triage vital signs and the nursing notes.                              Differential diagnosis includes, but is not limited to: Ankle sprain, fracture  Patient's presentation is most consistent with acute, uncomplicated illness.  46 year old female presenting to the emergency department for treatment and evaluation after twisting her right ankle yesterday.  See HPI for further details.  On exam she does have just diffuse swelling.  The compartment is soft.  Plan will be to put her in a cam walker and have her follow-up with podiatry in about a week for repeat exam and/or imaging.  She will continue taking ibuprofen at home.  She was encouraged to rest, ice, and elevate over the next few days.  She was encouraged to wear the CAM walker while she is out of bed  until pain and swelling have resolved or until cleared by podiatry.     FINAL CLINICAL IMPRESSION(S) / ED DIAGNOSES   Final diagnoses:  Sprain of right ankle, unspecified ligament, initial encounter  Effusion of right ankle     Rx / DC Orders   ED Discharge Orders     None        Note:  This document was prepared using Dragon voice recognition software and may include unintentional dictation errors.   Victorino Dike, FNP 05/04/22 1424    Naaman Plummer, MD 05/04/22 [phone]

## 2022-05-04 NOTE — Discharge Instructions (Addendum)
Please call and schedule an appointment with podiatry. Wear the boot when out of bed. Return to the ER for symptoms that change or worsen.

## 2022-05-09 ENCOUNTER — Ambulatory Visit: Payer: Commercial Managed Care - HMO | Admitting: Podiatry

## 2022-05-09 ENCOUNTER — Ambulatory Visit (INDEPENDENT_AMBULATORY_CARE_PROVIDER_SITE_OTHER): Payer: Commercial Managed Care - HMO

## 2022-05-09 DIAGNOSIS — S93401A Sprain of unspecified ligament of right ankle, initial encounter: Secondary | ICD-10-CM | POA: Diagnosis not present

## 2022-05-09 DIAGNOSIS — S99921A Unspecified injury of right foot, initial encounter: Secondary | ICD-10-CM

## 2022-05-09 NOTE — Patient Instructions (Addendum)
Call  Phone: [phone] to schedule your PT appointment at [facility]

## 2022-05-13 NOTE — Progress Notes (Signed)
  Subjective:  Patient ID: Natalie Robinson, female    DOB: 1976/06/02,  MRN: [id number]  Chief Complaint  Patient presents with   Foot Injury    Pt R foot/ankle thinks toe my be broken with fluid.    46 y.o. female presents with the above complaint. History confirmed with patient.  She injured her foot last Tuesday she stepped in a hole while baling hay.  She rolled her ankle in an inversion type mechanism.  She went to the [facility] ER and had ankle x-rays done and they put her in a cam boot.  Has had a lot of swelling and bruising.  Objective:  Physical Exam: warm, good capillary refill, no trophic changes or ulcerative lesions, normal DP and PT pulses, and normal sensory exam. Left Foot: normal exam, no swelling, tenderness, instability; ligaments intact, full range of motion of all ankle/foot joints Right Foot:  Edema and pain over the lateral ankle ligaments, some medially, none over the navicular or fifth metatarsal base  No images are attached to the encounter.  Radiographs: New right foot radiographs were taken today as well as review of her previous right ankle radiographs taken in the ER there is no fracture or dislocation obviously in any view Assessment:   1. Foot injury, right, initial encounter      Plan:  Patient was evaluated and treated and all questions answered.  I reviewed with her that she has a severe ankle sprain.  She currently has a cam boot and may be WBAT in this.  Advised to rest is much as possible ice and use anti-inflammatories as needed.  I sent a referral to [place] physical therapy and she will begin this in a few weeks.  I will see her back in 6 weeks for reevaluation  Return in about 6 weeks (around 06/20/2022) for recheck ankle sprain after PT.

## 2022-06-20 ENCOUNTER — Ambulatory Visit: Payer: Commercial Managed Care - HMO | Admitting: Podiatry

## 2022-07-04 ENCOUNTER — Ambulatory Visit: Payer: Commercial Managed Care - HMO | Admitting: Podiatry

## 2022-08-02 ENCOUNTER — Encounter: Payer: Self-pay | Admitting: Family Medicine

## 2022-08-02 ENCOUNTER — Ambulatory Visit: Payer: Commercial Managed Care - HMO | Admitting: Family Medicine

## 2022-08-02 VITALS — BP 120/78 | HR 65 | Temp 98.0°F | Wt 208.2 lb

## 2022-08-02 DIAGNOSIS — H6991 Unspecified Eustachian tube disorder, right ear: Secondary | ICD-10-CM | POA: Diagnosis not present

## 2022-08-02 NOTE — Progress Notes (Signed)
Subjective:     Natalie Robinson is a 46 y.o. female presenting for Ear Fullness (Pressure in right ear on and off for 3-4 months. Jaw tight and swollen nodule on the right side. Has tried over the counter sinus medication with mild relief.)     Ear Fullness  There is pain in the right ear. The current episode started more than 1 month ago. Episode frequency: intermittent. The problem has been waxing and waning. Associated symptoms include coughing, hearing loss and rhinorrhea. Pertinent negatives include no diarrhea, ear discharge, headaches, neck pain, sore throat or vomiting.   Treatment: advil for sleep  Review of Systems  Constitutional:  Negative for chills and fever.  HENT:  Positive for hearing loss, rhinorrhea, sinus pressure and sinus pain. Negative for ear discharge, sore throat, tinnitus and trouble swallowing.        Swollen glands  Respiratory:  Positive for cough.   Gastrointestinal:  Negative for diarrhea, nausea and vomiting.  Musculoskeletal:  Negative for arthralgias, myalgias and neck pain.  Neurological:  Negative for headaches.     Social History   Tobacco Use  Smoking Status Former   Packs/day: 0.10   Years: 10.00   Total pack years: 1.00   Types: Cigarettes   Quit date: 11/29/2003   Years since quitting: 18.6  Smokeless Tobacco Never        Objective:    BP Readings from Last 3 Encounters:  08/02/22 120/78  05/04/22 126/84  01/24/22 120/80   Wt Readings from Last 3 Encounters:  08/02/22 208 lb 3.2 oz (94.4 kg)  05/04/22 200 lb (90.7 kg)  01/24/22 212 lb 3 oz (96.2 kg)    BP 120/78   Pulse 65   Temp 98 F (36.7 C) (Oral)   Wt 208 lb 3.2 oz (94.4 kg)   SpO2 98%   BMI 32.61 kg/m    Physical Exam Constitutional:      General: She is not in acute distress.    Appearance: She is well-developed. She is not diaphoretic.  HENT:     Head: Normocephalic and atraumatic.     Right Ear: Tympanic membrane and ear canal normal.     Left  Ear: Tympanic membrane and ear canal normal.     Nose: Mucosal edema present. No rhinorrhea.     Right Sinus: Frontal sinus tenderness present. No maxillary sinus tenderness.     Left Sinus: No maxillary sinus tenderness or frontal sinus tenderness.     Mouth/Throat:     Pharynx: Uvula midline. No oropharyngeal exudate or posterior oropharyngeal erythema.     Tonsils: 0 on the right. 0 on the left.  Eyes:     General: No scleral icterus.    Conjunctiva/sclera: Conjunctivae normal.  Cardiovascular:     Rate and Rhythm: Normal rate and regular rhythm.     Heart sounds: Normal heart sounds. No murmur heard. Pulmonary:     Effort: Pulmonary effort is normal. No respiratory distress.     Breath sounds: Normal breath sounds.  Musculoskeletal:     Cervical back: Neck supple.  Lymphadenopathy:     Cervical: No cervical adenopathy.  Skin:    General: Skin is warm and dry.     Capillary Refill: Capillary refill takes less than 2 seconds.  Neurological:     Mental Status: She is alert.           Assessment & Plan:   Problem List Items Addressed This Visit  Nervous and Auditory   Eustachian tube disorder, right - Primary    Cont xyzal 5 mg. Start flonase and saline rinse. Try pseudoephedrine. Update if no improvement as given some sinus pressure possible sinus infection.         Return if symptoms worsen or fail to improve.  Lesleigh Noe, MD

## 2022-08-02 NOTE — Assessment & Plan Note (Signed)
Cont xyzal 5 mg. Start flonase and saline rinse. Try pseudoephedrine. Update if no improvement as given some sinus pressure possible sinus infection.

## 2022-08-02 NOTE — Patient Instructions (Addendum)
1) Continue Xyzal 2) Start daily sinus rinse (neti pot) 3) Start Flonase daily - can take twice daily if helping 4) As needed ok to take pseudoephedrine  If worsening let me know   If no improvement in 1 week can do course of antibiotics

## 2022-08-09 ENCOUNTER — Encounter: Payer: Self-pay | Admitting: Family Medicine

## 2022-08-09 DIAGNOSIS — J0140 Acute pansinusitis, unspecified: Secondary | ICD-10-CM

## 2022-08-09 MED ORDER — AMOXICILLIN-POT CLAVULANATE 875-125 MG PO TABS: 1.0000 | ORAL_TABLET | Freq: Two times a day (BID) | ORAL | 0 refills | Status: AC

## 2022-10-12 ENCOUNTER — Ambulatory Visit: Payer: Commercial Managed Care - HMO | Admitting: Podiatry

## 2022-10-12 ENCOUNTER — Ambulatory Visit (INDEPENDENT_AMBULATORY_CARE_PROVIDER_SITE_OTHER): Payer: Commercial Managed Care - HMO

## 2022-10-12 DIAGNOSIS — M7750 Other enthesopathy of unspecified foot: Secondary | ICD-10-CM

## 2022-10-12 DIAGNOSIS — M7752 Other enthesopathy of left foot: Secondary | ICD-10-CM

## 2022-10-12 DIAGNOSIS — S8252XA Displaced fracture of medial malleolus of left tibia, initial encounter for closed fracture: Secondary | ICD-10-CM | POA: Diagnosis not present

## 2022-10-12 NOTE — Patient Instructions (Signed)
Ankle Sprain   An ankle sprain is a stretch or tear in one of the tough tissues (ligaments) that connect the bones in your ankle. An ankle sprain can happen when the ankle rolls outward (inversion sprain) or inward (eversion sprain). What are the causes? This condition is caused by rolling or twisting the ankle. What increases the risk? You are more likely to develop this condition if you play sports. What are the signs or symptoms? Symptoms of this condition include: Pain in your ankle. Swelling. Bruising. This may happen right after you sprain your ankle or 1-2 days later. Trouble standing or walking. How is this diagnosed? This condition is diagnosed with: A physical exam. During the exam, your doctor will press on certain parts of your foot and ankle and try to move them in certain ways. X-ray imaging. These may be taken to see how bad the sprain is and to check for broken bones. How is this treated? This condition may be treated with: A brace or splint. This is used to keep the ankle from moving until it heals. An elastic bandage. This is used to support the ankle. Crutches. Pain medicine. Surgery. This may be needed if the sprain is very bad. Physical therapy. This may help to improve movement in the ankle. Follow these instructions at home: If you have a brace or a splint: Wear the brace or splint as told by your doctor. Remove it only as told by your doctor. Loosen the brace or splint if your toes: Tingle. Lose feeling (become numb). Turn cold and blue. Keep the brace or splint clean. If the brace or splint is not waterproof: Do not let it get wet. Cover it with a watertight covering when you take a bath or a shower. If you have an elastic bandage (dressing): Remove it to shower or bathe. Try not to move your ankle much, but wiggle your toes from time to time. This helps to prevent swelling. Adjust the dressing if it feels too tight. Loosen the dressing if your  foot: Loses feeling. Tingles. Becomes cold and blue. Managing pain, stiffness, and swelling   Take over-the-counter and prescription medicines only as told by doctor. For 2-3 days, keep your ankle raised (elevated) above the level of your heart. If told, put ice on the injured area: If you have a removable brace or splint, remove it as told by your doctor. Put ice in a plastic bag. Place a towel between your skin and the bag. Leave the ice on for 20 minutes, 2-3 times a day. General instructions Rest your ankle. Do not use your injured leg to support your body weight until your doctor says that you can. Use crutches as told by your doctor. Do not use any products that contain nicotine or tobacco, such as cigarettes, e-cigarettes, and chewing tobacco. If you need help quitting, ask your doctor. Keep all follow-up visits as told by your doctor. Contact a doctor if: Your bruises or swelling are quickly getting worse. Your pain does not get better after you take medicine. Get help right away if: You cannot feel your toes or foot. Your foot or toes look blue. You have very bad pain that gets worse. Summary An ankle sprain is a stretch or tear in one of the tough tissues (ligaments) that connect the bones in your ankle. This condition is caused by rolling or twisting the ankle. Symptoms include pain, swelling, bruising, and trouble walking. To help with pain and swelling, put ice on   the injured ankle, raise your ankle above the level of your heart, and use an elastic bandage. Also, rest as told by your doctor. Keep all follow-up visits as told by your doctor. This is important. This information is not intended to replace advice given to you by your health care provider. Make sure you discuss any questions you have with your health care provider. Document Revised: 04/10/2018 Document Reviewed: 04/10/2018 Elsevier Patient Education  2020 [employer]  Ankle Sprain, Phase I Rehab An  ankle sprain is an injury to the ligaments of your ankle. Ankle sprains cause stiffness, loss of motion, and loss of strength. Ask your health care provider which exercises are safe for you. Do exercises exactly as told by your health care provider and adjust them as directed. It is normal to feel mild stretching, pulling, tightness, or discomfort as you do these exercises. Stop right away if you feel sudden pain or your pain gets worse. Do not begin these exercises until told by your health care provider. Stretching and range-of-motion exercises These exercises warm up your muscles and joints and improve the movement and flexibility of your lower leg and ankle. These exercises also help to relieve pain and stiffness. Gastroc and soleus stretch  This exercise is also called a calf stretch. It stretches the muscles in the back of the lower leg. These muscles are the gastrocnemius, or gastroc, and the soleus. Sit on the floor with your left / right leg extended. Loop a belt or towel around the ball of your left / right foot. The ball of your foot is on the walking surface, right under your toes. Keep your left / right ankle and foot relaxed and keep your knee straight while you use the belt or towel to pull your foot toward you. You should feel a gentle stretch behind your calf or knee in your gastroc muscle. Hold this position for 10 seconds, then release to the starting position. Repeat the exercise with your knee bent. You can put a pillow or a rolled bath towel under your knee to support it. You should feel a stretch deep in your calf in the soleus muscle or at your Achilles tendon. Repeat 10 times. Complete this exercise 2 times a day. Ankle alphabet   Sit with your left / right leg supported at the lower leg. Do not rest your foot on anything. Make sure your foot has room to move freely. Think of your left / right foot as a paintbrush. Move your foot to trace each letter of the alphabet in the  air. Keep your hip and knee still while you trace. Make the letters as large as you can without feeling discomfort. Trace every letter from A to Z. Repeat 10 times. Complete this exercise 2 times a day. Strengthening exercises These exercises build strength and endurance in your ankle and lower leg. Endurance is the ability to use your muscles for a long time, even after they get tired. Ankle dorsiflexion   Secure a rubber exercise band or tube to an object, such as a table leg, that will stay still when the band is pulled. Secure the other end around your left / right foot. Sit on the floor facing the object, with your left / right leg extended. The band or tube should be slightly tense when your foot is relaxed. Slowly bring your foot toward you, bringing the top of your foot toward your shin (dorsiflexion), and pulling the band tighter. Hold this position for   10 seconds. Slowly return your foot to the starting position. Repeat 10 times. Complete this exercise 2 times a day. Ankle plantar flexion   Sit on the floor with your left / right leg extended. Loop a rubber exercise tube or band around the ball of your left / right foot. The ball of your foot is on the walking surface, right under your toes. Hold the ends of the band or tube in your hands. The band or tube should be slightly tense when your foot is relaxed. Slowly point your foot and toes downward to tilt the top of your foot away from your shin (plantar flexion). Hold this position for 10 seconds. Slowly return your foot to the starting position. Repeat 10 times. Complete this exercise 2 times a day. Ankle eversion Sit on the floor with your legs straight out in front of you. Loop a rubber exercise band or tube around the ball of your left / right foot. The ball of your foot is on the walking surface, right under your toes. Hold the ends of the band in your hands, or secure the band to a stable object. The band or tube should  be slightly tense when your foot is relaxed. Slowly push your foot outward, away from your other leg (eversion). Hold this position for 10 seconds. Slowly return your foot to the starting position. Repeat 10 times. Complete this exercise 2 times a day. This information is not intended to replace advice given to you by your health care provider. Make sure you discuss any questions you have with your health care provider. Document Revised: 03/05/2019 Document Reviewed: 08/27/2018 Elsevier Patient Education  2020 Elsevier Inc.  

## 2022-10-12 NOTE — Progress Notes (Signed)
  Subjective:  Patient ID: Natalie Robinson, female    DOB: 16-Jan-1976,  MRN: [id number]  Chief Complaint  Patient presents with   Ankle Injury    Sprained L ankle    46 y.o. female presents with the above complaint. History confirmed with patient.  She now injured her left side about a month ago she said it feels a lot worse it was bruising.  Objective:  Physical Exam: warm, good capillary refill, no trophic changes or ulcerative lesions, normal DP and PT pulses, and normal sensory exam. Left Foot: She has tenderness over the medial malleolus, mild over lateral Right Foot: No pain or instability  No images are attached to the encounter.  Radiographs: New right foot radiographs were taken today show very small avulsion fracture of the distal medial malleolus Assessment:   1. Avulsion fracture of medial malleolus of left tibia, closed, initial encounter      Plan:  Patient was evaluated and treated and all questions answered.  I reviewed her radiographs with her discussed that she has an avulsion fracture of the medial malleolus and a severe sprain.  I recommended she return to her cam walker boot and she will do this for the next month.  After that she may begin physical therapy may need to be longer for longer periods of time.  Advised that should be resolved by 8 weeks.  If not better by that point recommend MRI and formal PT.  She will follow with me as needed.  OTC NSAIDs as needed, ice and elevate as needed  Return if symptoms worsen or fail to improve.

## 2022-10-26 NOTE — Progress Notes (Unsigned)
PCP: Waunita Schooner, MD   No chief complaint on file.   HPI:      Ms. Natalie Robinson is a 46 y.o. [phone] whose LMP was No LMP recorded. Patient has had an ablation., presents today for her annual examination.  Her menses are {norm/abn:715}, lasting {number: 22536} days.  Dysmenorrhea {dysmen:716}. She {does:18564} have intermenstrual bleeding. S/p endometrial ablation 2017 She {does:18564} have vasomotor sx.   Sex activity: single partner, contraception - tubal ligation. She {does:18564} have vaginal dryness.  Last Pap: 10/06/20 Results were: no abnormalities /neg HPV DNA.  Hx of STDs: {STD hx:14358}  Last mammogram: 02/23/22 Results were: normal--routine follow-up in 12 months There is no FH of breast cancer. There is no FH of ovarian cancer. The patient {does:18564} do self-breast exams.  Colonoscopy: 2/22 with polyp at [facility]; Repeat due after 10 years. FH colon cancer in uncles  Tobacco use: {tob:20664} Alcohol use: {Alcohol:11675} No drug use Exercise: {exercise:31265}  She {does:18564} get adequate calcium and Vitamin D in her diet.  Labs 11/22; had elevated lipids, due for repeat Hx of thyroid nodule since 2014 with normal labs 11/22 and thyroid u/s 12/22 with borderline thyromegaly and benign cyst; no further imaging recommended  Patient Active Problem List   Diagnosis Date Noted   Eustachian tube disorder, right 08/02/2022   Skin rash 03/10/2021   Encounter for screening colonoscopy    Thyroid nodule 10/21/2019   History of squamous cell carcinoma 10/21/2019   Migraine 10/21/2019   Overweight (BMI 25.0-29.9) 10/21/2019    Past Surgical History:  Procedure Laterality Date   ABLATION  2017   CESAREAN SECTION  12/07/12,08/11/15   COLONOSCOPY WITH PROPOFOL N/A 01/25/2021   Procedure: COLONOSCOPY WITH PROPOFOL;  Surgeon: Lin Landsman, MD;  Location: [facility];  Service: Gastroenterology;  Laterality: N/A;   NASAL SINUS SURGERY     SKIN CANCER  EXCISION     under nose   TUBAL LIGATION  2016    Family History  Problem Relation Age of Onset   Colon cancer Maternal Uncle 60   Colon cancer Maternal Uncle 21   Hyperlipidemia Mother    Hyperlipidemia Father    Arthritis Father    Cancer Father        skin caner   Stroke Maternal Grandmother    Hyperlipidemia Maternal Grandfather    Stroke Paternal Grandmother    Mental illness Sister    Breast cancer Neg Hx     Social History   Socioeconomic History   Marital status: Married    Spouse name: Elta Guadeloupe   Number of children: 3   Years of education: College   Highest education level: Not on file  Occupational History   Not on file  Tobacco Use   Smoking status: Former    Packs/day: 0.10    Years: 10.00    Total pack years: 1.00    Types: Cigarettes    Quit date: 11/29/2003    Years since quitting: 18.9   Smokeless tobacco: Never  Vaping Use   Vaping Use: Never used  Substance and Sexual Activity   Alcohol use: Yes    Comment: 1x per week, 1 serving   Drug use: Never   Sexual activity: Yes    Birth control/protection: Surgical    Comment: Ablation   Other Topics Concern   Not on file  Social History Narrative   10/21/19   From: [place]: with Husband Elta Guadeloupe), and 3 children   Work: has a  horse farm and boards horses      Family: twins - Aiden and Rolla Plate (2014) and Thomasena Edis (2015)      Enjoys: spend time with family, reading       Exercise: burn boot camp - 3 times a week   Diet: trying to manage calories - low carb - bar for breakfast/shake for lunch, regular meal for dinner      Safety   Seat belts: Yes    Guns: Yes  and secure   Safe in relationships: Yes    Social Determinants of Health   Financial Resource Strain: Low Risk  (10/21/2019)   Overall Financial Resource Strain (CARDIA)    Difficulty of Paying Living Expenses: Not hard at all  Food Insecurity: Not on file  Transportation Needs: Not on file  Physical Activity: Not on file  Stress:  Not on file  Social Connections: Not on file  Intimate Partner Violence: Not on file     Current Outpatient Medications:    Ascorbic Acid (VITAMIN C) 500 MG CAPS, Take 1,000 mg by mouth daily., Disp: , Rfl:    BIOTIN PO, Take by mouth., Disp: , Rfl:    Calcium Carbonate (CALCIUM 600 PO), Take 600 mg by mouth daily., Disp: , Rfl:    ferrous sulfate 325 (65 FE) MG tablet, Take by mouth., Disp: , Rfl:    hydrOXYzine (VISTARIL) 25 MG capsule, Take 1 capsule (25 mg total) by mouth 2 (two) times daily as needed for itching., Disp: 30 capsule, Rfl: 0   levocetirizine (XYZAL) 5 MG tablet, Take by mouth., Disp: , Rfl:    Multiple Vitamin (MULTI-VITAMIN) tablet, Take by mouth., Disp: , Rfl:    vitamin B-12 (CYANOCOBALAMIN) 100 MCG tablet, Take 100 mcg by mouth daily., Disp: , Rfl:    VITAMIN D PO, Take by mouth., Disp: , Rfl:      ROS:  Review of Systems BREAST: No symptoms    Objective: There were no vitals taken for this visit.   OBGyn Exam  Results: No results found for this or any previous visit (from the past 24 hour(s)).  Assessment/Plan:  No diagnosis found.   No orders of the defined types were placed in this encounter.           GYN counsel {counseling: 16159}    F/U  No follow-ups on file.  Shawnay Bramel B. Karlisha Mathena, PA-C 10/26/2022 7:04 PM

## 2022-10-27 ENCOUNTER — Ambulatory Visit (INDEPENDENT_AMBULATORY_CARE_PROVIDER_SITE_OTHER): Payer: Commercial Managed Care - HMO | Admitting: Obstetrics and Gynecology

## 2022-10-27 ENCOUNTER — Encounter: Payer: Self-pay | Admitting: Obstetrics and Gynecology

## 2022-10-27 VITALS — BP 120/80 | Ht 67.0 in | Wt 210.0 lb

## 2022-10-27 DIAGNOSIS — Z01419 Encounter for gynecological examination (general) (routine) without abnormal findings: Secondary | ICD-10-CM | POA: Diagnosis not present

## 2022-10-27 DIAGNOSIS — E782 Mixed hyperlipidemia: Secondary | ICD-10-CM

## 2022-10-27 DIAGNOSIS — N946 Dysmenorrhea, unspecified: Secondary | ICD-10-CM

## 2022-10-27 DIAGNOSIS — Z0000 Encounter for general adult medical examination without abnormal findings: Secondary | ICD-10-CM

## 2022-10-27 DIAGNOSIS — E041 Nontoxic single thyroid nodule: Secondary | ICD-10-CM

## 2022-10-27 DIAGNOSIS — Z1231 Encounter for screening mammogram for malignant neoplasm of breast: Secondary | ICD-10-CM

## 2022-10-27 DIAGNOSIS — Z13220 Encounter for screening for lipoid disorders: Secondary | ICD-10-CM

## 2022-10-27 MED ORDER — CYCLOBENZAPRINE HCL 10 MG PO TABS: 10.0000 mg | ORAL_TABLET | Freq: Two times a day (BID) | ORAL | 0 refills | Status: DC | PRN

## 2022-10-27 NOTE — Patient Instructions (Addendum)
I value your feedback and you entrusting us with your care. If you get a [facility] patient survey, I would appreciate you taking the time to let us know about your experience today. Thank you!  [facility]: [phone]      

## 2022-11-23 ENCOUNTER — Other Ambulatory Visit: Payer: Commercial Managed Care - HMO

## 2022-11-23 DIAGNOSIS — E782 Mixed hyperlipidemia: Secondary | ICD-10-CM

## 2022-11-23 DIAGNOSIS — Z13220 Encounter for screening for lipoid disorders: Secondary | ICD-10-CM

## 2022-11-23 DIAGNOSIS — E041 Nontoxic single thyroid nodule: Secondary | ICD-10-CM

## 2022-11-23 DIAGNOSIS — Z0000 Encounter for general adult medical examination without abnormal findings: Secondary | ICD-10-CM

## 2022-11-24 LAB — COMPREHENSIVE METABOLIC PANEL
ALT: 15 IU/L (ref 0–32)
AST: 14 IU/L (ref 0–40)
Albumin/Globulin Ratio: 2.1 (ref 1.2–2.2)
Albumin: 4.4 g/dL (ref 3.9–4.9)
Alkaline Phosphatase: 70 IU/L (ref 44–121)
BUN/Creatinine Ratio: 18 (ref 9–23)
BUN: 14 mg/dL (ref 6–24)
Bilirubin Total: 0.3 mg/dL (ref 0.0–1.2)
CO2: 23 mmol/L (ref 20–29)
Calcium: 9.9 mg/dL (ref 8.7–10.2)
Chloride: 105 mmol/L (ref 96–106)
Creatinine, Ser: 0.78 mg/dL (ref 0.57–1.00)
Globulin, Total: 2.1 g/dL (ref 1.5–4.5)
Glucose: 83 mg/dL (ref 70–99)
Potassium: 4.4 mmol/L (ref 3.5–5.2)
Sodium: 143 mmol/L (ref 134–144)
Total Protein: 6.5 g/dL (ref 6.0–8.5)
eGFR: 95 mL/min/{1.73_m2} (ref >59)

## 2022-11-24 LAB — LIPID PANEL
Chol/HDL Ratio: 4.4 ratio (ref 0.0–4.4)
Cholesterol, Total: 216 mg/dL — ABNORMAL HIGH (ref 100–199)
HDL: 49 mg/dL (ref >39)
LDL Chol Calc (NIH): 143 mg/dL — ABNORMAL HIGH (ref 0–99)
Triglycerides: 131 mg/dL (ref 0–149)
VLDL Cholesterol Cal: 24 mg/dL (ref 5–40)

## 2022-11-24 LAB — TSH+FREE T4
Free T4: 1.15 ng/dL (ref 0.82–1.77)
TSH: 2.86 u[IU]/mL (ref 0.450–4.500)

## 2022-12-06 ENCOUNTER — Ambulatory Visit: Payer: Commercial Managed Care - HMO | Admitting: Podiatry

## 2022-12-06 ENCOUNTER — Ambulatory Visit (INDEPENDENT_AMBULATORY_CARE_PROVIDER_SITE_OTHER): Payer: Commercial Managed Care - HMO

## 2022-12-06 DIAGNOSIS — S8252XA Displaced fracture of medial malleolus of left tibia, initial encounter for closed fracture: Secondary | ICD-10-CM

## 2022-12-06 DIAGNOSIS — S8252XS Displaced fracture of medial malleolus of left tibia, sequela: Secondary | ICD-10-CM

## 2022-12-06 DIAGNOSIS — M25372 Other instability, left ankle: Secondary | ICD-10-CM

## 2022-12-06 DIAGNOSIS — M7750 Other enthesopathy of unspecified foot: Secondary | ICD-10-CM

## 2022-12-06 NOTE — Progress Notes (Signed)
  Subjective:  Patient ID: Natalie Robinson, female    DOB: 02/08/1976,  MRN: [id number]  Chief Complaint  Patient presents with   Fracture    left ankle still painful    47 y.o. female presents with the above complaint. History confirmed with patient.  Still quite painful it has been about 8 weeks she has been doing therapy with a boot for 6 weeks continues to have pain.  Feeling the right foot started to hurt as well  Objective:  Physical Exam: warm, good capillary refill, no trophic changes or ulcerative lesions, normal DP and PT pulses, and normal sensory exam. Left Foot: She has tenderness over the medial malleolus, along the PT tendon and insertion of the navicular Right Foot: Some pain over the PT tendon insertion  No images are attached to the encounter.  Radiographs: New right foot radiographs were taken today show unchanged alignment and persistent avulsion fracture of the distal medial malleolus Assessment:   1. Tendonitis of ankle or foot   2. Closed avulsion fracture of medial malleolus of left tibia, sequela   3. Instability of left ankle joint      Plan:  Patient was evaluated and treated and all questions answered.  Still has had not much improvement despite boot immobilization and PT.  Recommend she continue physical therapy for strengthening and stabilization and referral was sent to [facility].  At this point I recommend an MRI because I am concerned primarily of instability of the deltoid ligament complex as well as possible injury of the posterior tibial tendon.  This MRI may be necessary for surgical intervention which we discussed.  I will see her back after the study for follow-up.  Return for after MRI to review.

## 2022-12-06 NOTE — Patient Instructions (Signed)
Call [facility] to schedule your MRI at the below locations.  Please allow at least 1 business day after your visit to process the referral.  It may take longer depending on approval from insurance.  Please let me know if you have issues or problems scheduling the MRI   [facility] [phone] [address] , [place] [address]  [facility] [phone] [address] [place],  [address]  

## 2022-12-19 ENCOUNTER — Encounter: Payer: Self-pay | Admitting: Podiatry

## 2022-12-21 ENCOUNTER — Ambulatory Visit
Admission: RE | Admit: 2022-12-21 | Discharge: 2022-12-21 | Disposition: A | Discharge status: Home / Self Care | Payer: Commercial Managed Care - HMO | Source: Ambulatory Visit | Attending: Podiatry | Admitting: Podiatry

## 2022-12-21 DIAGNOSIS — M7750 Other enthesopathy of unspecified foot: Secondary | ICD-10-CM

## 2022-12-21 DIAGNOSIS — S8252XS Displaced fracture of medial malleolus of left tibia, sequela: Secondary | ICD-10-CM

## 2022-12-21 DIAGNOSIS — M25372 Other instability, left ankle: Secondary | ICD-10-CM

## 2023-01-04 ENCOUNTER — Ambulatory Visit: Payer: Commercial Managed Care - HMO | Admitting: Podiatry

## 2023-01-04 DIAGNOSIS — S8252XA Displaced fracture of medial malleolus of left tibia, initial encounter for closed fracture: Secondary | ICD-10-CM

## 2023-01-04 DIAGNOSIS — S93409S Sprain of unspecified ligament of unspecified ankle, sequela: Secondary | ICD-10-CM

## 2023-01-04 DIAGNOSIS — M25372 Other instability, left ankle: Secondary | ICD-10-CM

## 2023-01-08 NOTE — Progress Notes (Signed)
  Subjective:  Patient ID: Natalie Robinson, female    DOB: Jan 18, 1976,  MRN: [id number]  Chief Complaint  Patient presents with   Ankle Pain    MRI results     47 y.o. female presents with the above complaint. History confirmed with patient.  Still feels about the same, physical therapy did not receive her referral.  She completed the MRI.  Objective:  Physical Exam: warm, good capillary refill, no trophic changes or ulcerative lesions, normal DP and PT pulses, and normal sensory exam. Left Foot: She has tenderness over the medial malleolus, she has some tenderness over the ATFL with slight laxity here  No images are attached to the encounter.  Radiographs: New right foot radiographs were taken today show unchanged alignment and persistent avulsion fracture of the distal medial malleolus  IMPRESSION: 1. Partial tear of the anterior talofibular ligament. 2. Tiny subacute to chronic avulsion fracture at the inferior tip of the medial malleolus. 3. Mild peroneal and flexor tenosynovitis.     Electronically Signed   By: Titus Dubin M.D.   On: 12/21/2022 13:21 Assessment:   1. Instability of left ankle joint   2. Severe ankle sprain, sequela   3. Avulsion fracture of medial malleolus of left tibia, closed, initial encounter       Plan:  Patient was evaluated and treated and all questions answered.  We reviewed the results of her MRI.  I would like to send this for a second opinion read.  There is partial tear of the ATFL, I suspect she has dual medial and lateral instability as well, the superficial deltoid on the coronal views is intact but I do think that the deep deltoids have been compromised with partial avulsion of the medial malleolus.  We discussed surgical intervention with arthroscopy and ankle stabilization of the medial and possible lateral ankle ligaments.  She would like to consider this for the summer and will let me know when she is ready to proceed.  In  the meantime we will send MRI for overread and resend the physical therapy referral to [facility].  No follow-ups on file.

## 2023-01-11 NOTE — Progress Notes (Signed)
Overread and referral has both been faxed over to be addressed.

## 2023-01-25 ENCOUNTER — Encounter: Payer: Managed Care, Other (non HMO) | Admitting: Family Medicine

## 2023-01-25 ENCOUNTER — Encounter: Payer: Self-pay | Admitting: Family

## 2023-01-25 ENCOUNTER — Ambulatory Visit (INDEPENDENT_AMBULATORY_CARE_PROVIDER_SITE_OTHER): Payer: Commercial Managed Care - HMO | Admitting: Family

## 2023-01-25 VITALS — BP 110/72 | HR 76 | Temp 97.6°F | Ht 67.0 in | Wt 212.8 lb

## 2023-01-25 DIAGNOSIS — Z0001 Encounter for general adult medical examination with abnormal findings: Secondary | ICD-10-CM | POA: Diagnosis not present

## 2023-01-25 DIAGNOSIS — L508 Other urticaria: Secondary | ICD-10-CM

## 2023-01-25 DIAGNOSIS — T781XXA Other adverse food reactions, not elsewhere classified, initial encounter: Secondary | ICD-10-CM | POA: Insufficient documentation

## 2023-01-25 DIAGNOSIS — N946 Dysmenorrhea, unspecified: Secondary | ICD-10-CM

## 2023-01-25 DIAGNOSIS — E782 Mixed hyperlipidemia: Secondary | ICD-10-CM

## 2023-01-25 DIAGNOSIS — J0120 Acute ethmoidal sinusitis, unspecified: Secondary | ICD-10-CM | POA: Diagnosis not present

## 2023-01-25 DIAGNOSIS — G43829 Menstrual migraine, not intractable, without status migrainosus: Secondary | ICD-10-CM

## 2023-01-25 DIAGNOSIS — Z6833 Body mass index (BMI) 33.0-33.9, adult: Secondary | ICD-10-CM

## 2023-01-25 DIAGNOSIS — E6609 Other obesity due to excess calories: Secondary | ICD-10-CM

## 2023-01-25 DIAGNOSIS — D500 Iron deficiency anemia secondary to blood loss (chronic): Secondary | ICD-10-CM | POA: Diagnosis not present

## 2023-01-25 DIAGNOSIS — E559 Vitamin D deficiency, unspecified: Secondary | ICD-10-CM

## 2023-01-25 DIAGNOSIS — E66811 Other obesity due to excess calories: Secondary | ICD-10-CM

## 2023-01-25 DIAGNOSIS — Z8589 Personal history of malignant neoplasm of other organs and systems: Secondary | ICD-10-CM

## 2023-01-25 DIAGNOSIS — Z8781 Personal history of (healed) traumatic fracture: Secondary | ICD-10-CM

## 2023-01-25 MED ORDER — AMOXICILLIN-POT CLAVULANATE 875-125 MG PO TABS: 1.0000 | ORAL_TABLET | Freq: Two times a day (BID) | ORAL | 0 refills | Status: DC

## 2023-01-25 NOTE — Assessment & Plan Note (Signed)
Will order in future continue vitamin d supplement

## 2023-01-25 NOTE — Assessment & Plan Note (Signed)
Pt advised to work on diet and exercise as tolerated

## 2023-01-25 NOTE — Assessment & Plan Note (Signed)
Ordered lipid panel as future order for pt to come back fasting, pending results. Work on low cholesterol diet and exercise as tolerated Worsening.

## 2023-01-25 NOTE — Assessment & Plan Note (Signed)
Sees an allergist for this, stable. Hydroxyxine as needed.

## 2023-01-25 NOTE — Assessment & Plan Note (Signed)
Improved.  Seeing GYN.  Takes flexeril prn during these times and it is very helpful.

## 2023-01-25 NOTE — Assessment & Plan Note (Signed)
Stable. 

## 2023-01-25 NOTE — Assessment & Plan Note (Signed)
Cont f/u as scheduled with dermatology

## 2023-01-25 NOTE — Progress Notes (Signed)
Established Patient Office Visit  Subjective:  Patient ID: Natalie Robinson, female    DOB: 1976/07/12  Age: 47 y.o. MRN: [id number]  CC:  Chief Complaint  Patient presents with   Establish Care   Annual Exam    HPI Natalie Robinson is here for a transition of care visit as well as here for annual exam.   Dental cleanings: up to date twice yearly.  Annual eye exams, not up to date. However she will f/u dec 2024. States she is stable on RX, wears glasses which she only wears for driving  Stds, denies known history of STDS  TDAP up to date. Will be due in two years.  Colonoscopy: had in 01/25/21, repeat in seven years due to polyp   Mammogram, gets yearly,. Has scheduled for April 2024 Prior provider was: Dr. Waunita Schooner   Pt is with acute concerns.  Mid January was diagnosed with covid and states still with nasal congestion ,and still feels lightheaded here and there with dizzy spells when standing up too quickly. No feer or chills. Still slight cough non productive without chest congestion. Sometimes with some sputum that is yellow in nature. When she had covid she did not take anything medication wise.   chronic concerns:  H/o left ankle fracture, with torn ligament, requiring surgery however holding off until the fall.   Chronic urticaria: on hydroxyxine prn and sees allergist.   Severe monthly cramps with periods, takes flexeril prn with muscle spasms with periods. She states this has been great.   IDA: takes daily iron.   H/o skin cancer, sees dermatologist twice yearly.   Hyperlipidemia: had lab work in December, has cut down on red meat since this last was checked. And she was eating fried foods which she was eating more of with kids in the family. She knows she has to get weight off but hard to lose weight and exercise due to ankle pain.   Lab Results  Component Value Date   CHOL 216 (H) 11/23/2022   HDL 49 11/23/2022   LDLCALC 143 (H) 11/23/2022   TRIG 131  11/23/2022   CHOLHDL 4.4 11/23/2022     Past Medical History:  Diagnosis Date   Allergy    Asthma    Frequent headaches    History of chicken pox    Migraines    Skin cancer 2017    Past Surgical History:  Procedure Laterality Date   ABLATION  2017   CESAREAN SECTION  12/07/12,08/11/15   COLONOSCOPY WITH PROPOFOL N/A 01/25/2021   Procedure: COLONOSCOPY WITH PROPOFOL;  Surgeon: Lin Landsman, MD;  Location: [facility];  Service: Gastroenterology;  Laterality: N/A;   NASAL SINUS SURGERY     SKIN CANCER EXCISION     under nose   TUBAL LIGATION  2016    Family History  Problem Relation Age of Onset   Hyperlipidemia Mother    Hyperlipidemia Father    Arthritis Father    Skin cancer Father    Mental illness Sister        anxiety depression   Heart disease Maternal Grandmother    Hyperlipidemia Maternal Grandfather    Stroke Paternal Grandmother    Prostate cancer Maternal Uncle 60   Prostate cancer Maternal Uncle 46   Breast cancer Neg Hx     Social History   Socioeconomic History   Marital status: Married    Spouse name: Elta Guadeloupe   Number of children: 3   Years of education: [employer]  Highest education level: Not on file  Occupational History   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  Tobacco Use   Smoking status: Former    Packs/day: 0.10    Years: 10.00    Total pack years: 1.00    Types: Cigarettes    Quit date: 11/29/2003    Years since quitting: 19.1   Smokeless tobacco: Never  Vaping Use   Vaping Use: Never used  Substance and Sexual Activity   Alcohol use: Yes    Comment: 1x per week, 1 serving   Drug use: Never   Sexual activity: Yes    Partners: Male    Birth control/protection: Surgical    Comment: Tubal Ligation  Other Topics Concern   Not on file  Social History Narrative   10/21/19   From: [place]: with Husband Public relations account executive), and 3 children   Work: has a Systems developer      Family: twins -  Aiden and Sport and exercise psychologist (2014) and Thomasena Edis (2015)      Enjoys: spend time with family, reading       Exercise: [employer] - 3 times a week   Diet: trying to manage calories - low carb - bar for breakfast/shake for lunch, regular meal for dinner      Safety   Seat belts: Yes    Guns: Yes  and secure   Safe in relationships: Yes    Social Determinants of Health   Financial Resource Strain: Low Risk  (10/21/2019)   Overall Financial Resource Strain (CARDIA)    Difficulty of Paying Living Expenses: Not hard at all  Food Insecurity: Not on file  Transportation Needs: Not on file  Physical Activity: Not on file  Stress: Not on file  Social Connections: Not on file  Intimate Partner Violence: Not on file    Outpatient Medications Prior to Visit  Medication Sig Dispense Refill   Ascorbic Acid (VITAMIN C) 500 MG CAPS Take 1,000 mg by mouth daily.     BIOTIN PO Take by mouth.     Calcium Carbonate (CALCIUM 600 PO) Take 600 mg by mouth daily.     cyclobenzaprine (FLEXERIL) 10 MG tablet Take 1 tablet (10 mg total) by mouth 2 (two) times daily as needed for muscle spasms. 30 tablet 0   ferrous sulfate 325 (65 FE) MG tablet Take by mouth.     fluticasone (FLONASE SENSIMIST) 27.5 MCG/SPRAY nasal spray 2 sprays each nostril Nasal Once Daily for 30 days     hydrocortisone 2.5 % ointment 1 application Externally Twice a day for 90 days     hydrOXYzine (VISTARIL) 25 MG capsule Take 1 capsule (25 mg total) by mouth 2 (two) times daily as needed for itching. 30 capsule 0   levocetirizine (XYZAL) 5 MG tablet Take by mouth.     Methylcobalamin (B12-ACTIVE) 1 MG CHEW as directed Orally     Multiple Vitamin (MULTI-VITAMIN) tablet Take by mouth.     VITAMIN D PO Take by mouth.     Multiple Vitamins-Minerals (MULTI FOR HER) TABS as directed Orally     No facility-administered medications prior to visit.    No Known Allergies  ROS: Pertinent symptoms negative unless otherwise noted in HPI       Objective:    Physical Exam Vitals reviewed.  Constitutional:      General: She is not in acute distress.    Appearance: Normal appearance. She  is obese. She is not ill-appearing or toxic-appearing.  HENT:     Right Ear: Tympanic membrane normal.     Left Ear: Tympanic membrane normal.     Mouth/Throat:     Mouth: Mucous membranes are moist.     Pharynx: No pharyngeal swelling.     Tonsils: No tonsillar exudate.  Eyes:     Extraocular Movements: Extraocular movements intact.     Conjunctiva/sclera: Conjunctivae normal.     Pupils: Pupils are equal, round, and reactive to light.  Neck:     Thyroid: No thyroid mass.  Cardiovascular:     Rate and Rhythm: Normal rate and regular rhythm.     Comments: Around ankle Pulmonary:     Effort: Pulmonary effort is normal.     Breath sounds: Normal breath sounds.  Abdominal:     General: Abdomen is flat. Bowel sounds are normal.     Palpations: Abdomen is soft.  Musculoskeletal:        General: Normal range of motion.     Left lower leg: 1+ Edema present.  Lymphadenopathy:     Cervical:     Right cervical: No superficial cervical adenopathy.    Left cervical: No superficial cervical adenopathy.  Skin:    General: Skin is warm.     Capillary Refill: Capillary refill takes less than 2 seconds.  Neurological:     General: No focal deficit present.     Mental Status: She is alert and oriented to person, place, and time.  Psychiatric:        Mood and Affect: Mood normal.        Behavior: Behavior normal.        Thought Content: Thought content normal.        Judgment: Judgment normal.      BP 110/72   Pulse 76   Temp 97.6 F (36.4 C) (Temporal)   Ht '5\' 7"'$  (1.702 m)   Wt 212 lb 12.8 oz (96.5 kg)   SpO2 99%   BMI 33.33 kg/m  Wt Readings from Last 3 Encounters:  01/25/23 212 lb 12.8 oz (96.5 kg)  10/27/22 210 lb (95.3 kg)  08/02/22 208 lb 3.2 oz (94.4 kg)     Health Maintenance Due  Topic Date Due   COVID-19 Vaccine  (3 - 2023-24 season) 07/29/2022    There are no preventive care reminders to display for this patient.  Lab Results  Component Value Date   TSH 2.860 11/23/2022   Lab Results  Component Value Date   WBC 6.3 10/27/2021   HGB 14.0 10/27/2021   HCT 41.2 10/27/2021   MCV 93 10/27/2021   PLT 215 10/15/2020   Lab Results  Component Value Date   NA 143 11/23/2022   K 4.4 11/23/2022   CO2 23 11/23/2022   GLUCOSE 83 11/23/2022   BUN 14 11/23/2022   CREATININE 0.78 11/23/2022   BILITOT 0.3 11/23/2022   ALKPHOS 70 11/23/2022   AST 14 11/23/2022   ALT 15 11/23/2022   PROT 6.5 11/23/2022   ALBUMIN 4.4 11/23/2022   CALCIUM 9.9 11/23/2022   EGFR 95 11/23/2022   Lab Results  Component Value Date   CHOL 216 (H) 11/23/2022   Lab Results  Component Value Date   HDL 49 11/23/2022   Lab Results  Component Value Date   LDLCALC 143 (H) 11/23/2022   Lab Results  Component Value Date   TRIG 131 11/23/2022   Lab Results  Component Value Date  CHOLHDL 4.4 11/23/2022   No results found for: "HGBA1C"    Assessment & Plan:   Acute non-recurrent ethmoidal sinusitis Assessment & Plan: Prescription given for augmentin 875/125 mg po bid for ten days. Pt to continue tylenol/ibuprofen prn sinus pain. Continue with humidifier prn and steam showers recommended as well. instructed If no symptom improvement in 48 hours please f/u  Did advise pt to start daily flonase.   Orders: -     Amoxicillin-Pot Clavulanate; Take 1 tablet by mouth 2 (two) times daily.  Dispense: 20 tablet; Refill: 0  Chronic urticaria Assessment & Plan: Sees an allergist for this, stable. Hydroxyxine as needed.    Iron deficiency anemia due to chronic blood loss Assessment & Plan: Repeat cbc next lab only visit. Order placed. Continue daily iron.   Orders: -     CBC; Future  Vitamin D deficiency Assessment & Plan: Will order in future continue vitamin d supplement  Orders: -     VITAMIN D 25 Hydroxy  (Vit-D Deficiency, Fractures); Future  Mixed hyperlipidemia Assessment & Plan: Ordered lipid panel as future order for pt to come back fasting, pending results. Work on low cholesterol diet and exercise as tolerated Worsening.  Orders: -     Lipid panel; Future  History of ankle fracture  Dysmenorrhea Assessment & Plan: Improved.  Seeing GYN.  Takes flexeril prn during these times and it is very helpful.    Menstrual migraine without status migrainosus, not intractable Assessment & Plan: Stable.   Class 1 obesity due to excess calories without serious comorbidity with body mass index (BMI) of 33.0 to 33.9 in adult Assessment & Plan: Pt advised to work on diet and exercise as tolerated    History of squamous cell carcinoma Assessment & Plan: Cont f/u as scheduled with dermatology    Encounter for general adult medical examination with abnormal findings Assessment & Plan: Patient Counseling(The following topics were reviewed):  Preventative care handout given to pt  Health maintenance and immunizations reviewed. Please refer to Health maintenance section. Pt advised on safe sex, wearing seatbelts in car, and proper nutrition labwork ordered today for annual Dental health: Discussed importance of regular tooth brushing, flossing, and dental visits.      Meds ordered this encounter  Medications   amoxicillin-clavulanate (AUGMENTIN) 875-125 MG tablet    Sig: Take 1 tablet by mouth 2 (two) times daily.    Dispense:  20 tablet    Refill:  0    Order Specific Question:   Supervising Provider    Answer:   Diona Browner, AMY E [[id number]]    Follow-up: Return in about 1 year (around 01/26/2024) for f/u CPE.    Eugenia Pancoast, FNP

## 2023-01-25 NOTE — Assessment & Plan Note (Signed)
Repeat cbc next lab only visit. Order placed. Continue daily iron.

## 2023-01-25 NOTE — Assessment & Plan Note (Signed)
Patient Counseling(The following topics were reviewed): ? Preventative care handout given to pt  ?Health maintenance and immunizations reviewed. Please refer to Health maintenance section. ?Pt advised on safe sex, wearing seatbelts in car, and proper nutrition ?labwork ordered today for annual ?Dental health: Discussed importance of regular tooth brushing, flossing, and dental visits. ? ? ?

## 2023-01-25 NOTE — Assessment & Plan Note (Addendum)
Prescription given for augmentin 875/125 mg po bid for ten days. Pt to continue tylenol/ibuprofen prn sinus pain. Continue with humidifier prn and steam showers recommended as well. instructed If no symptom improvement in 48 hours please f/u  Did advise pt to start daily flonase.

## 2023-02-21 ENCOUNTER — Ambulatory Visit: Payer: Commercial Managed Care - HMO | Admitting: Podiatry

## 2023-03-08 ENCOUNTER — Ambulatory Visit
Admission: RE | Admit: 2023-03-08 | Discharge: 2023-03-08 | Disposition: A | Discharge status: Home / Self Care | Payer: Commercial Managed Care - HMO | Source: Ambulatory Visit | Attending: Obstetrics and Gynecology | Admitting: Obstetrics and Gynecology

## 2023-03-08 DIAGNOSIS — Z1231 Encounter for screening mammogram for malignant neoplasm of breast: Secondary | ICD-10-CM | POA: Diagnosis present

## 2023-03-16 ENCOUNTER — Encounter: Payer: Commercial Managed Care - HMO | Admitting: Podiatry

## 2023-03-30 ENCOUNTER — Encounter: Payer: Commercial Managed Care - HMO | Admitting: Podiatry

## 2023-04-20 ENCOUNTER — Encounter: Payer: Commercial Managed Care - HMO | Admitting: Podiatry

## 2023-04-25 ENCOUNTER — Other Ambulatory Visit (INDEPENDENT_AMBULATORY_CARE_PROVIDER_SITE_OTHER): Payer: Commercial Managed Care - HMO

## 2023-04-25 DIAGNOSIS — D500 Iron deficiency anemia secondary to blood loss (chronic): Secondary | ICD-10-CM

## 2023-04-25 DIAGNOSIS — E559 Vitamin D deficiency, unspecified: Secondary | ICD-10-CM

## 2023-04-25 DIAGNOSIS — E782 Mixed hyperlipidemia: Secondary | ICD-10-CM | POA: Diagnosis not present

## 2023-04-25 LAB — CBC
HCT: 41.8 % (ref 36.0–46.0)
Hemoglobin: 14 g/dL (ref 12.0–15.0)
MCHC: 33.4 g/dL (ref 30.0–36.0)
MCV: 94.4 fl (ref 78.0–100.0)
Platelets: 250 10*3/uL (ref 150.0–400.0)
RBC: 4.43 Mil/uL (ref 3.87–5.11)
RDW: 12.5 % (ref 11.5–15.5)
WBC: 7 10*3/uL (ref 4.0–10.5)

## 2023-04-25 LAB — VITAMIN D 25 HYDROXY (VIT D DEFICIENCY, FRACTURES): VITD: 67.66 ng/mL (ref 30.00–100.00)

## 2023-04-25 LAB — LIPID PANEL
Cholesterol: 184 mg/dL (ref 0–200)
HDL: 42.7 mg/dL (ref >39.00)
LDL Cholesterol: 105 mg/dL — ABNORMAL HIGH (ref 0–99)
NonHDL: 141.01
Total CHOL/HDL Ratio: 4
Triglycerides: 178 mg/dL — ABNORMAL HIGH (ref 0.0–149.0)
VLDL: 35.6 mg/dL (ref 0.0–40.0)

## 2023-07-24 ENCOUNTER — Ambulatory Visit: Payer: Commercial Managed Care - HMO | Admitting: Podiatry

## 2023-07-24 ENCOUNTER — Encounter: Payer: Self-pay | Admitting: Podiatry

## 2023-07-24 DIAGNOSIS — M25872 Other specified joint disorders, left ankle and foot: Secondary | ICD-10-CM | POA: Diagnosis not present

## 2023-07-24 DIAGNOSIS — M25372 Other instability, left ankle: Secondary | ICD-10-CM

## 2023-07-24 DIAGNOSIS — M62462 Contracture of muscle, left lower leg: Secondary | ICD-10-CM | POA: Diagnosis not present

## 2023-07-24 NOTE — Progress Notes (Signed)
  Subjective:  Patient ID: Natalie Robinson, female    DOB: [birth year],  MRN: [id number]  Chief Complaint  Patient presents with   Foot Pain    "I'm scheduled to have surgery in October.  So, I'm here about that."    47 y.o. female presents with the above complaint. History confirmed with patient.  She has been doing physical therapy for about 6 months, had her surgery scheduled into October.  Has not had much improvement still feels really uncomfortable and painful on the medial side on uneven ground feels like her ankle locks up and catches in her Achilles feels very tight as well  Objective:  Physical Exam: warm, good capillary refill, no trophic changes or ulcerative lesions, normal DP and PT pulses, and normal sensory exam. Left Foot:, Pain and impingement on the anterior joint line with palpation and dorsiflexion, still most of her pain is along the deltoid complex with palpation and eversion, less on the ATFL today.  Gastrocnemius equinus is present.  No images are attached to the encounter.  Radiographs: New right foot radiographs were taken today show unchanged alignment and persistent avulsion fracture of the distal medial malleolus  IMPRESSION: 1. Partial tear of the anterior talofibular ligament. 2. Tiny subacute to chronic avulsion fracture at the inferior tip of the medial malleolus. 3. Mild peroneal and flexor tenosynovitis.     Electronically Signed   By: Obie Dredge M.D.   On: 12/21/2022 13:21 Assessment:   1. Instability of left ankle joint   2. Ankle impingement syndrome, left   3. Gastrocnemius equinus of left lower extremity       Plan:  Patient was evaluated and treated and all questions answered.  She returns for follow-up having exhausted all her options on physical therapy.  She is ready to proceed with surgical intervention.  Surgically we discussed the evaluation of the ankle joint with an diagnostic arthroscopy as well as extensive  debridement of any impinging structures and scar tissue and synovitis.  We also discussed intraoperatively that we will evaluate both the deltoid and ATFL arthroscopically as well as under stress radiography.  If there is any laxity here which I do expect some on the deltoid ligament that surgical repair and removal of any loose body will be necessary.  She does have significant equinus as well and this has not improved, physical therapy has not been able to overcome this as well and I recommended a gastrocnemius recession.  All questions were addressed today.  Informed consent signed and reviewed.  We discussed the recovery process and period of nonweightbearing for 4 weeks followed by physical therapy at that [facility].  She would like to change her physical therapy location because her current therapist is no longer in network with her and she will let me know where she would like to do this.   Surgical plan:  Procedure: -Left ankle arthroscopy, possible medial and lateral ligament stabilization, gastrocnemius recession, PRP injection  Location: -[facility]  Anesthesia plan: -General With block  Postoperative pain plan: - Tylenol 1000 mg every 6 hours, ibuprofen 600 mg every 6 hours, gabapentin 300 mg every 8 hours x5 days, oxycodone 5 mg 1-2 tabs every 6 hours only as needed  DVT prophylaxis: -ASA 325 mg twice daily  WB Restrictions / DME needs: -NWB in splint with scooter postop  No follow-ups on file.

## 2023-08-07 ENCOUNTER — Encounter: Payer: Self-pay | Admitting: Podiatry

## 2023-08-07 DIAGNOSIS — M25372 Other instability, left ankle: Secondary | ICD-10-CM

## 2023-08-07 DIAGNOSIS — M25872 Other specified joint disorders, left ankle and foot: Secondary | ICD-10-CM

## 2023-08-08 ENCOUNTER — Telehealth: Payer: Self-pay | Admitting: Podiatry

## 2023-08-08 NOTE — Telephone Encounter (Signed)
DOS-09/01/2023  LIGAMENT REPAIR [id number] ANKLE ARTHROSCOPY [id number] GASTROCNEMIUS RECESS [id number]  [employer] EFFECTIVE DATE-11/28/2021  DEDUCTIBLE: $6500 WITH REMAINING $ 3,828.41  OOP- $9,450.00 WITH REMAINING $ 6,181.80  COINSURANCE- 50%  PER [employer]'S AUTOMATED SYSTEM NO PRIOR AUTH IS REQUIRED FOR CPT CODES 95284,13244 AND [phone].  REFERENCE NUMBERS: [id number] AND [id number]

## 2023-09-01 ENCOUNTER — Other Ambulatory Visit: Payer: Self-pay | Admitting: Podiatry

## 2023-09-01 DIAGNOSIS — M25872 Other specified joint disorders, left ankle and foot: Secondary | ICD-10-CM

## 2023-09-01 DIAGNOSIS — M25372 Other instability, left ankle: Secondary | ICD-10-CM

## 2023-09-01 HISTORY — PX: ANKLE SURGERY: SHX546

## 2023-09-01 MED ORDER — IBUPROFEN 600 MG PO TABS: 600.0000 mg | ORAL_TABLET | Freq: Three times a day (TID) | ORAL | 0 refills | Status: AC | PRN

## 2023-09-01 MED ORDER — ASPIRIN 325 MG PO TBEC
325.0000 mg | DELAYED_RELEASE_TABLET | Freq: Two times a day (BID) | ORAL | 0 refills | Status: AC
Start: 2023-09-01 — End: 2023-10-01

## 2023-09-01 MED ORDER — ACETAMINOPHEN 500 MG PO TABS: 1000.0000 mg | ORAL_TABLET | Freq: Four times a day (QID) | ORAL | 0 refills | Status: AC | PRN

## 2023-09-01 MED ORDER — GABAPENTIN 300 MG PO CAPS: 300.0000 mg | ORAL_CAPSULE | Freq: Three times a day (TID) | ORAL | 0 refills | Status: DC

## 2023-09-01 MED ORDER — OXYCODONE HCL 5 MG PO TABS: 5.0000 mg | ORAL_TABLET | ORAL | 0 refills | Status: AC | PRN

## 2023-09-01 NOTE — Progress Notes (Unsigned)
09/01/23 L ankle arthroscopy / stabilization

## 2023-09-05 ENCOUNTER — Telehealth: Payer: Self-pay | Admitting: Podiatry

## 2023-09-05 NOTE — Telephone Encounter (Signed)
Gave pt instructions and she said thank you so much

## 2023-09-05 NOTE — Telephone Encounter (Signed)
Pt called had surgery 10/4 ans is asking if normal to feel swelling in surgical foot? Tightness in foot and ankle? Is this normal she is elevating it? Anything else she should be doing? Bandage feels tight around foot only.

## 2023-09-07 ENCOUNTER — Ambulatory Visit: Payer: Commercial Managed Care - HMO

## 2023-09-07 ENCOUNTER — Ambulatory Visit (INDEPENDENT_AMBULATORY_CARE_PROVIDER_SITE_OTHER): Payer: Commercial Managed Care - HMO | Admitting: Podiatry

## 2023-09-07 ENCOUNTER — Encounter: Payer: Self-pay | Admitting: Podiatry

## 2023-09-07 DIAGNOSIS — S8252XA Displaced fracture of medial malleolus of left tibia, initial encounter for closed fracture: Secondary | ICD-10-CM

## 2023-09-07 DIAGNOSIS — M25872 Other specified joint disorders, left ankle and foot: Secondary | ICD-10-CM

## 2023-09-07 DIAGNOSIS — M25372 Other instability, left ankle: Secondary | ICD-10-CM | POA: Diagnosis not present

## 2023-09-07 NOTE — Progress Notes (Signed)
  Subjective:  Patient ID: Natalie Robinson, female    DOB: 31-Oct-1976,  MRN: [id number]  Chief Complaint  Patient presents with   Routine Post Op    S/p ankle arthroscopy and stabilization 10/4    DOS: 09/01/23 Procedure: left ankle stabilization, arthroscopy   47 y.o. female returns for post-op check. She is doing well not having much pain   Review of Systems: Negative except as noted in the HPI. Denies N/V/F/Ch.   Objective:  There were no vitals filed for this visit. There is no height or weight on file to calculate BMI. Constitutional Well developed. Well nourished.  Vascular Foot warm and well perfused. Capillary refill normal to all digits.  Calf is soft and supple, no posterior calf or knee pain, negative Homans' sign  Neurologic Normal speech. Oriented to person, place, and time. Epicritic sensation to light touch grossly present bilaterally.  Dermatologic Skin healing well without signs of infection. Skin edges well coapted without signs of infection.  Orthopedic: Tenderness to palpation noted about the surgical site.   Multiple view plain film radiographs: No complication noted.    1. Instability of left ankle joint   2. Ankle impingement syndrome, left   3. Avulsion fracture of medial malleolus of left tibia, closed, initial encounter    Plan:  Patient was evaluated and treated and all questions answered.  S/p foot surgery left -Progressing as expected post-operatively.  Intraoperative findings reviewed with the patient and her husband.  -WB Status: NWB in posterior below-knee splint.  We will transition to boot next visit -Sutures: Plan to remove in 2 weeks.  -Foot redressed.  Well-padded below the knee posterior splint was reapplied  Return in about 2 weeks (around 09/21/2023) for suture removal.

## 2023-09-08 ENCOUNTER — Other Ambulatory Visit: Payer: Self-pay | Admitting: Podiatry

## 2023-09-12 MED ORDER — GABAPENTIN 300 MG PO CAPS: 300.0000 mg | ORAL_CAPSULE | Freq: Three times a day (TID) | ORAL | 0 refills | Status: DC

## 2023-09-21 ENCOUNTER — Ambulatory Visit (INDEPENDENT_AMBULATORY_CARE_PROVIDER_SITE_OTHER): Payer: Self-pay | Admitting: Podiatry

## 2023-09-21 ENCOUNTER — Encounter: Payer: Self-pay | Admitting: Podiatry

## 2023-09-21 DIAGNOSIS — M25372 Other instability, left ankle: Secondary | ICD-10-CM

## 2023-09-21 DIAGNOSIS — M25872 Other specified joint disorders, left ankle and foot: Secondary | ICD-10-CM

## 2023-09-21 DIAGNOSIS — S8252XA Displaced fracture of medial malleolus of left tibia, initial encounter for closed fracture: Secondary | ICD-10-CM

## 2023-09-21 NOTE — Progress Notes (Signed)
  Subjective:  Patient ID: Natalie Robinson, female    DOB: 09/26/1976,  MRN: [id number]  Chief Complaint  Patient presents with   Routine Post Op    POV # 2 DOS 09/01/2023 --- LEFT ANKLE ARTHROSCOPY, LIGAMENT REPAIRS, PRP INJECTION   "Its feeling okay. Just feels like it gets real swollen sometimes"    DOS: 09/01/23 Procedure: left ankle stabilization, arthroscopy   47 y.o. female returns for post-op check. She is doing well things feel swollen  Review of Systems: Negative except as noted in the HPI. Denies N/V/F/Ch.   Objective:  There were no vitals filed for this visit. There is no height or weight on file to calculate BMI. Constitutional Well developed. Well nourished.  Vascular Foot warm and well perfused. Capillary refill normal to all digits.  Calf is soft and supple, no posterior calf or knee pain, negative Homans' sign  Neurologic Normal speech. Oriented to person, place, and time. Epicritic sensation to light touch grossly present bilaterally.  Dermatologic Skin healing well without signs of infection. Skin edges well coapted without signs of infection.  Orthopedic: Tenderness to palpation noted about the surgical site.  Mild edema   Multiple view plain film radiographs: No complication noted.    1. Instability of left ankle joint   2. Ankle impingement syndrome, left   3. Avulsion fracture of medial malleolus of left tibia, closed, initial encounter     Plan:  Patient was evaluated and treated and all questions answered.  S/p foot surgery left -Overall doing very well all sutures removed today and Steri-Strips were applied Ace wrap applied.  She will go back to her cam walker boot and begins therapy in a week and a half.  She may begin weightbearing at that time.  May begin gentle active and passive range of motion at this point as well.  Compression sleeve applied and once the Steri-Strips have serve the purpose she may use this instead of the Ace wrap.  I will  see her back in 3 weeks to follow-up  Return in about 3 weeks (around 10/12/2023) for post op (no x-rays).

## 2023-09-23 ENCOUNTER — Other Ambulatory Visit: Payer: Self-pay | Admitting: Podiatry

## 2023-10-12 ENCOUNTER — Encounter: Payer: Self-pay | Admitting: Podiatry

## 2023-10-12 ENCOUNTER — Encounter: Payer: Commercial Managed Care - HMO | Admitting: Podiatry

## 2023-10-19 ENCOUNTER — Encounter: Payer: Self-pay | Admitting: Podiatry

## 2023-10-19 ENCOUNTER — Ambulatory Visit: Payer: Commercial Managed Care - HMO | Admitting: Podiatry

## 2023-10-19 VITALS — Ht 67.0 in | Wt 212.0 lb

## 2023-10-19 DIAGNOSIS — M25372 Other instability, left ankle: Secondary | ICD-10-CM

## 2023-10-19 DIAGNOSIS — S8252XA Displaced fracture of medial malleolus of left tibia, initial encounter for closed fracture: Secondary | ICD-10-CM

## 2023-10-19 DIAGNOSIS — M25872 Other specified joint disorders, left ankle and foot: Secondary | ICD-10-CM

## 2023-10-19 NOTE — Progress Notes (Signed)
  Subjective:  Patient ID: Natalie Robinson, female    DOB: 15-Mar-1976,  MRN: [id number]  Chief Complaint  Patient presents with   Routine Post Op    RM24: POV # 3 DOS 09/01/2023 --- LEFT ANKLE ARTHROSCOPY, LIGAMENT REPAIRS, PRP INJECTION Patient states foot is feeling good    DOS: 09/01/23 Procedure: left ankle stabilization, arthroscopy   47 y.o. female returns for post-op check. She is doing well very pleased with her progress, she picked up the brace this week and is using it and therapy is going well  Review of Systems: Negative except as noted in the HPI. Denies N/V/F/Ch.   Objective:  There were no vitals filed for this visit. Body mass index is 33.2 kg/m. Constitutional Well developed. Well nourished.  Vascular Foot warm and well perfused. Capillary refill normal to all digits.  Calf is soft and supple, no posterior calf or knee pain, negative Homans' sign  Neurologic Normal speech. Oriented to person, place, and time. Epicritic sensation to light touch grossly present bilaterally.  Dermatologic Incision is nearly fully healed there is some scab left with no active drainage or infection  Orthopedic: Sensitivity the scars but no pain to palpation.  Good stability of the deltoid and lateral complex, negative anterior drawer good range of motion of the ankle and subtalar joints   Multiple view plain film radiographs: No complication noted.    1. Instability of left ankle joint   2. Ankle impingement syndrome, left   3. Avulsion fracture of medial malleolus of left tibia, closed, initial encounter     Plan:  Patient was evaluated and treated and all questions answered.  S/p foot surgery left -She is doing very well and has made good progress.  She has transitioned already to the ankle brace with a good supportive shoe.  She is able to tolerate even walking barefoot at home.  She may continue to progress through this and I think she likely will need to wear the brace for  another 1 to 2 weeks and starting around the week of December 2 she can transition to regular shoes without the brace begin to gradually increase her activity with nonimpact exercise with stationary bike and elliptical and short distance walking and increasing gradually.  Return in 6 weeks for final follow-up.  Return in about 6 weeks (around 11/30/2023) for follow up on ankle surgery .

## 2023-11-06 NOTE — Progress Notes (Signed)
PCP: Mort Sawyers, FNP   Chief Complaint  Patient presents with   Gynecologic Exam    No concerns    HPI:      Ms. Natalie Robinson is a 47 y.o. [phone] whose LMP was Patient's last menstrual period was 07/30/2023 (approximate)., presents today for her annual examination.  Her menses are infrequent now with ablation (were Q1-2 months last yr). Had 9/24 menses for 3-4 days, mod flow, no dysmen. Having some spotting today. Hx of endometrial ablation 2017. Takes flexeril for dysmen with sx improvement; sx usually worse at night and radiate down leg; failed heating pad, trazodone, pain meds. Has considered hyst but doesn't have time for the recovery; doesn't want BC. Has some VS sx; also hot/cold all night and is having trouble staying asleep at night (due to being hot/cold).   Sex activity: single partner, contraception - tubal ligation. She does not have pain/bleeding/dryness.  Last Pap: 10/07/19 Results were: no abnormalities /neg HPV DNA. No hx of abn paps.   Last mammogram: 03/08/23 Results were: normal--routine follow-up in 12 months There is no FH of breast cancer. There is no FH of ovarian cancer. The patient does occas do self-breast exams.  Colonoscopy: 2/22 with polyp at [facility]; Repeat due after 10 years. FH colon or pancreatic cancer in 2 mat uncles.   Tobacco use: The patient denies current or previous tobacco use. Alcohol use: social drinker No drug use Exercise: moderately active usually; hasn't bene able to exercise for a couple months due to ankle surg.   She does get adequate calcium and Vitamin D in her diet.  Borderline lipids 5/24 with PCP.  Hx of thyroid nodule since 2014 with normal labs 12/23 and thyroid u/s 12/22 with borderline thyromegaly and benign cyst; no further imaging recommended  Patient Active Problem List   Diagnosis Date Noted   Chronic urticaria 01/25/2023   Iron deficiency anemia due to chronic blood loss 01/25/2023   Vitamin D  deficiency 01/25/2023   History of ankle fracture 01/25/2023   Mixed hyperlipidemia 01/25/2023   Class 1 obesity due to excess calories without serious comorbidity with body mass index (BMI) of 33.0 to 33.9 in adult 01/25/2023   Acute non-recurrent ethmoidal sinusitis 01/25/2023   Encounter for general adult medical examination with abnormal findings 01/25/2023   Dysmenorrhea 10/27/2022   Thyroid nodule 10/21/2019   History of squamous cell carcinoma 10/21/2019   Migraine 10/21/2019    Past Surgical History:  Procedure Laterality Date   ABLATION  2017   ANKLE SURGERY  09/01/2023   left   CESAREAN SECTION  12/07/12,08/11/15   COLONOSCOPY WITH PROPOFOL N/A 01/25/2021   Procedure: COLONOSCOPY WITH PROPOFOL;  Surgeon: Toney Reil, MD;  Location: [facility] ENDOSCOPY;  Service: Gastroenterology;  Laterality: N/A;   NASAL SINUS SURGERY     SKIN CANCER EXCISION     under nose   TUBAL LIGATION  2016    Family History  Problem Relation Age of Onset   Hyperlipidemia Mother    Hyperlipidemia Father    Arthritis Father    Skin cancer Father    Mental illness Sister        anxiety depression   Heart disease Maternal Grandmother    Hyperlipidemia Maternal Grandfather    Stroke Paternal Grandmother    Prostate cancer Maternal Uncle 60   Prostate cancer Maternal Uncle 77   Breast cancer Neg Hx     Social History   Socioeconomic History   Marital status: Married  Spouse name: Loraine Leriche   Number of children: 3   Years of education: College   Highest education level: Not on file  Occupational History   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  Tobacco Use   Smoking status: Former    Current packs/day: 0.00    Average packs/day: 0.1 packs/day for 10.0 years (1.0 ttl pk-yrs)    Types: Cigarettes    Start date: 11/28/1993    Quit date: 11/29/2003    Years since quitting: 19.9   Smokeless tobacco: Never  Vaping Use   Vaping status: Never Used  Substance and Sexual  Activity   Alcohol use: Yes    Comment: 1x per week, 1 serving   Drug use: Never   Sexual activity: Yes    Partners: Male    Birth control/protection: Surgical    Comment: Tubal Ligation  Other Topics Concern   Not on file  Social History Narrative   10/21/19   From: [place]   Living: with Husband Government social research officer), and 3 children   Work: has a Dispensing optician      Family: twins - Aiden and Paediatric nurse (2014) and Enid Derry (2015)      Enjoys: spend time with family, reading       Exercise: burn [employer] - 3 times a week   Diet: trying to manage calories - low carb - bar for breakfast/shake for lunch, regular meal for dinner      Safety   Seat belts: Yes    Guns: Yes  and secure   Safe in relationships: Yes    Social Determinants of Health   Financial Resource Strain: Low Risk  (10/21/2019)   Overall Financial Resource Strain (CARDIA)    Difficulty of Paying Living Expenses: Not hard at all  Food Insecurity: Not on file  Transportation Needs: Not on file  Physical Activity: Not on file  Stress: Not on file  Social Connections: Not on file  Intimate Partner Violence: Not on file     Current Outpatient Medications:    Ascorbic Acid (VITAMIN C) 500 MG CAPS, Take 1,000 mg by mouth daily., Disp: , Rfl:    BIOTIN PO, Take by mouth., Disp: , Rfl:    Calcium Carbonate (CALCIUM 600 PO), Take 600 mg by mouth daily., Disp: , Rfl:    ferrous sulfate 325 (65 FE) MG tablet, Take by mouth., Disp: , Rfl:    fluticasone (FLONASE SENSIMIST) 27.5 MCG/SPRAY nasal spray, 2 sprays each nostril Nasal Once Daily for 30 days, Disp: , Rfl:    hydrocortisone 2.5 % ointment, 1 application Externally Twice a day for 90 days, Disp: , Rfl:    hydrOXYzine (VISTARIL) 25 MG capsule, Take 1 capsule (25 mg total) by mouth 2 (two) times daily as needed for itching., Disp: 30 capsule, Rfl: 0   levocetirizine (XYZAL) 5 MG tablet, Take by mouth., Disp: , Rfl:    Methylcobalamin (B12-ACTIVE) 1 MG CHEW, as directed  Orally, Disp: , Rfl:    Multiple Vitamin (MULTI-VITAMIN) tablet, Take by mouth., Disp: , Rfl:    TOLAK 4 % CREA, Apply topically daily., Disp: , Rfl:    VITAMIN D PO, Take by mouth., Disp: , Rfl:    cyclobenzaprine (FLEXERIL) 10 MG tablet, Take 1 tablet (10 mg total) by mouth 2 (two) times daily as needed for muscle spasms., Disp: 30 tablet, Rfl: 1     ROS:  Review of Systems  Constitutional:  Negative for  fatigue, fever and unexpected weight change.  Respiratory:  Negative for cough, shortness of breath and wheezing.   Cardiovascular:  Negative for chest pain, palpitations and leg swelling.  Gastrointestinal:  Negative for blood in stool, constipation, diarrhea, nausea and vomiting.  Endocrine: Negative for cold intolerance, heat intolerance and polyuria.  Genitourinary:  Negative for dyspareunia, dysuria, flank pain, frequency, genital sores, hematuria, menstrual problem, pelvic pain, urgency, vaginal bleeding, vaginal discharge and vaginal pain.  Musculoskeletal:  Negative for back pain, joint swelling and myalgias.  Skin:  Negative for rash.  Neurological:  Negative for dizziness, syncope, light-headedness, numbness and headaches.  Hematological:  Negative for adenopathy.  Psychiatric/Behavioral:  Negative for agitation, confusion, sleep disturbance and suicidal ideas. The patient is not nervous/anxious.    BREAST: No symptoms    Objective: BP 118/75   Pulse 65   Ht 5\' 7"  (1.702 m)   Wt 215 lb (97.5 kg)   LMP 07/30/2023 (Approximate)   BMI 33.67 kg/m    Physical Exam Constitutional:      Appearance: She is well-developed.  Genitourinary:     Vulva normal.     Right Labia: No rash, tenderness or lesions.    Left Labia: No tenderness, lesions or rash.    No vaginal discharge, erythema or tenderness.      Right Adnexa: not tender and no mass present.    Left Adnexa: not tender and no mass present.    No cervical friability or polyp.     Uterus is not enlarged or  tender.  Breasts:    Right: No mass, nipple discharge, skin change or tenderness.     Left: No mass, nipple discharge, skin change or tenderness.  Neck:     Thyroid: No thyromegaly.  Cardiovascular:     Rate and Rhythm: Normal rate and regular rhythm.     Heart sounds: Normal heart sounds. No murmur heard. Pulmonary:     Effort: Pulmonary effort is normal.     Breath sounds: Normal breath sounds.  Abdominal:     Palpations: Abdomen is soft.     Tenderness: There is no abdominal tenderness. There is no guarding or rebound.  Musculoskeletal:        General: Normal range of motion.     Cervical back: Normal range of motion.  Lymphadenopathy:     Cervical: No cervical adenopathy.  Neurological:     General: No focal deficit present.     Mental Status: She is alert and oriented to person, place, and time.     Cranial Nerves: No cranial nerve deficit.  Skin:    General: Skin is warm and dry.  Psychiatric:        Mood and Affect: Mood normal.        Behavior: Behavior normal.        Thought Content: Thought content normal.        Judgment: Judgment normal.  Vitals reviewed.     Assessment/Plan: Encounter for annual routine gynecological examination  Cervical cancer screening - Plan: Cytology - PAP  Screening for HPV (human papillomavirus) - Plan: Cytology - PAP  Encounter for screening mammogram for malignant neoplasm of breast - Plan: MM 3D SCREENING MAMMOGRAM BILATERAL BREAST, pt to schedule mammo 4/25  Dysmenorrhea - Plan: cyclobenzaprine (FLEXERIL) 10 MG tablet; Rx RF, doing well.   Thyroid nodule - Plan: TSH + free T4; repeat labs due today.   Blood tests for routine general physical examination - Plan: Comprehensive metabolic panel  Encounter for  immunization - Plan: Flu vaccine trivalent PF, 6mos and older(Flulaval,Afluria,Fluarix,Fluzone)   Meds ordered this encounter  Medications   cyclobenzaprine (FLEXERIL) 10 MG tablet    Sig: Take 1 tablet (10 mg total) by  mouth 2 (two) times daily as needed for muscle spasms.    Dispense:  30 tablet    Refill:  1    Order Specific Question:   Supervising Provider    Answer:   Waymon Budge           GYN counsel breast self exam, mammography screening, adequate intake of calcium and vitamin D, diet and exercise    F/U  Return in about 1 year (around 11/06/2024).  Kylin Genna B. Alexandre Lightsey, PA-C 11/07/2023 1:58 PM

## 2023-11-07 ENCOUNTER — Ambulatory Visit: Payer: Commercial Managed Care - HMO | Admitting: Obstetrics and Gynecology

## 2023-11-07 ENCOUNTER — Other Ambulatory Visit (HOSPITAL_COMMUNITY)
Admission: RE | Admit: 2023-11-07 | Discharge: 2023-11-07 | Disposition: A | Discharge status: Home / Self Care | Payer: Commercial Managed Care - HMO | Source: Ambulatory Visit | Attending: Obstetrics and Gynecology | Admitting: Obstetrics and Gynecology

## 2023-11-07 ENCOUNTER — Encounter: Payer: Self-pay | Admitting: Obstetrics and Gynecology

## 2023-11-07 VITALS — BP 118/75 | HR 65 | Ht 67.0 in | Wt 215.0 lb

## 2023-11-07 DIAGNOSIS — N946 Dysmenorrhea, unspecified: Secondary | ICD-10-CM

## 2023-11-07 DIAGNOSIS — Z124 Encounter for screening for malignant neoplasm of cervix: Secondary | ICD-10-CM

## 2023-11-07 DIAGNOSIS — Z0000 Encounter for general adult medical examination without abnormal findings: Secondary | ICD-10-CM

## 2023-11-07 DIAGNOSIS — Z1231 Encounter for screening mammogram for malignant neoplasm of breast: Secondary | ICD-10-CM

## 2023-11-07 DIAGNOSIS — E041 Nontoxic single thyroid nodule: Secondary | ICD-10-CM

## 2023-11-07 DIAGNOSIS — Z1151 Encounter for screening for human papillomavirus (HPV): Secondary | ICD-10-CM | POA: Insufficient documentation

## 2023-11-07 DIAGNOSIS — Z23 Encounter for immunization: Secondary | ICD-10-CM

## 2023-11-07 DIAGNOSIS — Z01419 Encounter for gynecological examination (general) (routine) without abnormal findings: Secondary | ICD-10-CM

## 2023-11-07 MED ORDER — CYCLOBENZAPRINE HCL 10 MG PO TABS: 10.0000 mg | ORAL_TABLET | Freq: Two times a day (BID) | ORAL | 1 refills | Status: DC | PRN

## 2023-11-07 NOTE — Patient Instructions (Signed)
I value your feedback and you entrusting us with your care. If you get a [facility] patient survey, I would appreciate you taking the time to let us know about your experience today. Thank you! ? ? ?

## 2023-11-08 LAB — COMPREHENSIVE METABOLIC PANEL
ALT: 19 [IU]/L (ref 0–32)
AST: 16 [IU]/L (ref 0–40)
Albumin: 4.6 g/dL (ref 3.9–4.9)
Alkaline Phosphatase: 70 [IU]/L (ref 44–121)
BUN/Creatinine Ratio: 25 — ABNORMAL HIGH (ref 9–23)
BUN: 19 mg/dL (ref 6–24)
Bilirubin Total: 0.3 mg/dL (ref 0.0–1.2)
CO2: 25 mmol/L (ref 20–29)
Calcium: 9.5 mg/dL (ref 8.7–10.2)
Chloride: 102 mmol/L (ref 96–106)
Creatinine, Ser: 0.77 mg/dL (ref 0.57–1.00)
Globulin, Total: 2.3 g/dL (ref 1.5–4.5)
Glucose: 105 mg/dL — ABNORMAL HIGH (ref 70–99)
Potassium: 3.8 mmol/L (ref 3.5–5.2)
Sodium: 143 mmol/L (ref 134–144)
Total Protein: 6.9 g/dL (ref 6.0–8.5)
eGFR: 96 mL/min/{1.73_m2} (ref >59)

## 2023-11-08 LAB — TSH+FREE T4
Free T4: 1.15 ng/dL (ref 0.82–1.77)
TSH: 2.3 u[IU]/mL (ref 0.450–4.500)

## 2023-11-09 LAB — CYTOLOGY - PAP
Comment: NEGATIVE
Diagnosis: NEGATIVE
High risk HPV: NEGATIVE

## 2023-12-05 ENCOUNTER — Ambulatory Visit (INDEPENDENT_AMBULATORY_CARE_PROVIDER_SITE_OTHER): Payer: Commercial Managed Care - HMO | Admitting: Podiatry

## 2023-12-05 ENCOUNTER — Encounter: Payer: Self-pay | Admitting: Podiatry

## 2023-12-05 VITALS — Ht 67.0 in

## 2023-12-05 DIAGNOSIS — M25872 Other specified joint disorders, left ankle and foot: Secondary | ICD-10-CM

## 2023-12-05 DIAGNOSIS — S8252XA Displaced fracture of medial malleolus of left tibia, initial encounter for closed fracture: Secondary | ICD-10-CM

## 2023-12-05 DIAGNOSIS — M25372 Other instability, left ankle: Secondary | ICD-10-CM

## 2023-12-05 NOTE — Progress Notes (Signed)
  Subjective:  Patient ID: Natalie Robinson, female    DOB: 1976/06/20,  MRN: [id number]  Chief Complaint  Patient presents with   Ankle Pain    She reports the ankle is doing good.     DOS: 09/01/23 Procedure: left ankle stabilization, arthroscopy   48 y.o. female returns for post-op check.  Doing well she is finished with therapy she is still utilizing the brace for most of her walking  Review of Systems: Negative except as noted in the HPI. Denies N/V/F/Ch.   Objective:  There were no vitals filed for this visit. Body mass index is 33.67 kg/m. Constitutional Well developed. Well nourished.  Vascular Foot warm and well perfused. Capillary refill normal to all digits.  Calf is soft and supple, no posterior calf or knee pain, negative Homans' sign  Neurologic Normal speech. Oriented to person, place, and time. Epicritic sensation to light touch grossly present bilaterally.  Dermatologic Incision is healed without hypertrophy  Orthopedic: Sensitivity has resolved.  No pain to palpation good range of motion of ankle joint no instability   Multiple view plain film radiographs: No complication noted.    1. Instability of left ankle joint     Plan:  Patient was evaluated and treated and all questions answered.  S/p foot surgery left -Doing very well and has completed her therapy she may transition out of the ankle brace and back to regular shoe gear as tolerated gradually increasing her activity may begin gentle impact exercise as well including running and jogging first of the ankle brace and then without as long as she is stable.  We discussed possibility of reinjury on slippery surfaces such as wet surfaces gravel and uneven ground and she will utilize hightop shoe gear and the brace as needed to protect these areas.  She will follow-up me as needed if she has further issues with this or other issues.  Return if symptoms worsen or fail to improve.

## 2024-01-29 ENCOUNTER — Encounter: Payer: Self-pay | Admitting: Family

## 2024-01-29 ENCOUNTER — Ambulatory Visit (INDEPENDENT_AMBULATORY_CARE_PROVIDER_SITE_OTHER): Payer: Commercial Managed Care - HMO | Admitting: Family

## 2024-01-29 VITALS — BP 112/70 | HR 64 | Temp 97.9°F | Ht 67.0 in | Wt 211.6 lb

## 2024-01-29 DIAGNOSIS — Z0001 Encounter for general adult medical examination with abnormal findings: Secondary | ICD-10-CM

## 2024-01-29 DIAGNOSIS — E782 Mixed hyperlipidemia: Secondary | ICD-10-CM

## 2024-01-29 DIAGNOSIS — R739 Hyperglycemia, unspecified: Secondary | ICD-10-CM | POA: Diagnosis not present

## 2024-01-29 DIAGNOSIS — Z6833 Body mass index (BMI) 33.0-33.9, adult: Secondary | ICD-10-CM

## 2024-01-29 DIAGNOSIS — Z72820 Sleep deprivation: Secondary | ICD-10-CM

## 2024-01-29 DIAGNOSIS — E6609 Other obesity due to excess calories: Secondary | ICD-10-CM

## 2024-01-29 DIAGNOSIS — E66811 Obesity, class 1: Secondary | ICD-10-CM

## 2024-01-29 DIAGNOSIS — R0683 Snoring: Secondary | ICD-10-CM | POA: Diagnosis not present

## 2024-01-29 DIAGNOSIS — Z13220 Encounter for screening for lipoid disorders: Secondary | ICD-10-CM

## 2024-01-29 DIAGNOSIS — J0120 Acute ethmoidal sinusitis, unspecified: Secondary | ICD-10-CM

## 2024-01-29 LAB — BASIC METABOLIC PANEL
BUN: 14 mg/dL (ref 6–23)
CO2: 27 meq/L (ref 19–32)
Calcium: 9.2 mg/dL (ref 8.4–10.5)
Chloride: 107 meq/L (ref 96–112)
Creatinine, Ser: 0.73 mg/dL (ref 0.40–1.20)
GFR: 97.52 mL/min (ref >60.00)
Glucose, Bld: 89 mg/dL (ref 70–99)
Potassium: 4.1 meq/L (ref 3.5–5.1)
Sodium: 141 meq/L (ref 135–145)

## 2024-01-29 LAB — CBC
HCT: 44 % (ref 36.0–46.0)
Hemoglobin: 14.6 g/dL (ref 12.0–15.0)
MCHC: 33.3 g/dL (ref 30.0–36.0)
MCV: 96.4 fl (ref 78.0–100.0)
Platelets: 224 10*3/uL (ref 150.0–400.0)
RBC: 4.56 Mil/uL (ref 3.87–5.11)
RDW: 12.4 % (ref 11.5–15.5)
WBC: 7.3 10*3/uL (ref 4.0–10.5)

## 2024-01-29 LAB — LIPID PANEL
Cholesterol: 212 mg/dL — ABNORMAL HIGH (ref 0–200)
HDL: 48.7 mg/dL (ref >39.00)
LDL Cholesterol: 129 mg/dL — ABNORMAL HIGH (ref 0–99)
NonHDL: 162.83
Total CHOL/HDL Ratio: 4
Triglycerides: 171 mg/dL — ABNORMAL HIGH (ref 0.0–149.0)
VLDL: 34.2 mg/dL (ref 0.0–40.0)

## 2024-01-29 MED ORDER — QSYMIA 3.75-23 MG PO CP24: ORAL_CAPSULE | ORAL | 0 refills | Status: DC

## 2024-01-29 MED ORDER — QSYMIA 7.5-46 MG PO CP24: ORAL_CAPSULE | ORAL | 2 refills | Status: DC

## 2024-01-29 MED ORDER — AMOXICILLIN-POT CLAVULANATE 875-125 MG PO TABS
1.0000 | ORAL_TABLET | Freq: Two times a day (BID) | ORAL | 0 refills | Status: DC
Start: 2024-01-29 — End: 2024-04-30

## 2024-01-29 NOTE — Assessment & Plan Note (Signed)
 Prescription given for augmentin 875/125 mg po bid for ten days. Pt to continue tylenol/ibuprofen prn sinus pain. Continue with humidifier prn and steam showers recommended as well. instructed If no symptom improvement in 48 hours please f/u

## 2024-01-29 NOTE — Assessment & Plan Note (Addendum)
 Pt advised to work on diet and exercise as tolerated Trial qsymia Pdmp reviewed non opioid contract signed The beneficiary does not have any FDA labeled contraindications to the requested agent including pregnancy, lactation, h/o medullary thyroid cancer or multiple endocrine neoplasia type II.

## 2024-01-29 NOTE — Patient Instructions (Addendum)
  Please work on the following to help with sleep:  -Sleep only long enough to feel rested then get out of bed -Go to bed and get up at the same time every day. -Do not try to force yourself to sleep. If you can't sleep, get out of bed adn try again later. -Have coffee, tea, and other foods that have caffeine only in the morning. -Avoid alcohol -Keep your bedroom dark, cool, quiet, and free of reminders of work or other things that cause you stress -Exercise several days a week, but not right before bed -Avoid looking at phones or reading devices ("e-books") that give off light before bed. This can make it harder to fall asleep  ------------------------------------  Would insurance approve zepbound and or wegovy?   ------------------------------------  I have sent an electronic order over to your preferred location for the following:   []   2D Mammogram  [x]   3D Mammogram  []   Bone Density   Please give this center a call to get scheduled at your convenience.  [x]   [facility]  [address] [place] [address]  [phone]  Make sure to wear two piece  clothing  No lotions powders or deodorants the day of the appointment Make sure to bring picture ID and insurance card.  Bring list of medications you are currently taking including any supplements.

## 2024-01-29 NOTE — Assessment & Plan Note (Signed)
 Sleep study ordered to r/o osa

## 2024-01-29 NOTE — Assessment & Plan Note (Signed)

## 2024-01-29 NOTE — Progress Notes (Signed)
 Subjective:  Patient ID: Natalie Robinson, female    DOB: 06/20/1976  Age: 48 y.o. MRN: [id number]  Patient Care Team: Mort Sawyers, FNP as PCP - General (Family Medicine)   CC:  Chief Complaint  Patient presents with   Annual Exam    HPI Natalie Robinson is a 48 y.o. female who presents today for an annual physical exam. She reports consuming a general diet. Exercise is limited by recent ankle surgery however trying to get back into a routine, trying to walk about 45 min to 1 hour 2-3 times a week. She generally feels well. She reports sleeping fairly well. She does have additional problems to discuss today.   Vision:Within last year Dental:Receives regular dental care  Mammogram: 03/08/23 Last pap: 11/07/23 normal Colonoscopy: 01/25/2021 every ten years.  Pt is with acute concerns.  Increased fatigue, having a hard time focusing more than usual she also is with sinus pressure. She has been told that she might had ADD. Hard to concentrate at times however she thinks if she can work on exercise and diet then she might have improvement. 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 She states she wakes up often throughout the night, not always difficult to fall asleep. She will take melatonin at night time. Husband does tell her she snores but more often with a sinus issue. She does state even without sinus she has these concerns as well. Seeing a naturopath who suspects hormones as well.   Started with allergies with nasal congestion and pnd for the last one week, but did wake up this am with a lot of sinus pressure and tenderness. She is taking xyzal, has flonase at home not taking as often.   Obesity:  Diet: really trying to work on a diet plan. Has her good and bad days. She does not stress eat, will boredom eat when she is home. Has tried weight watchers in the past, counting carbs, and has  tried phentermine in the past as well. Has tried keto. Right now her focus is working on portion control. She has had calorie deficit following calories.  Exercises: trying to work back up to it after recent ankle surgery.  No personal family history of thyroid cancer no constipation, no family history of neo-endocrine dysplasia.   Advanced Directives Patient does not have advanced directives   DEPRESSION SCREENING    01/29/2024    8:53 AM 01/25/2023   11:26 AM 01/24/2022    9:32 AM 10/25/2021   10:27 AM 10/26/2020    9:04 AM 10/21/2019   10:01 AM  PHQ 2/9 Scores  PHQ - 2 Score 0 0 0 0 0 0  PHQ- 9 Score 6 4  2        ROS: Negative unless specifically indicated above in HPI.    Current Outpatient Medications:    amoxicillin-clavulanate (AUGMENTIN) 875-125 MG tablet, Take 1 tablet by mouth 2 (two) times daily., Disp: 20 tablet, Rfl: 0   Ascorbic Acid (VITAMIN C) 500 MG CAPS, Take 1,000 mg by mouth daily., Disp: , Rfl:    BIOTIN PO, Take by mouth., Disp: , Rfl:    Calcium Carbonate (CALCIUM 600 PO), Take 600 mg by mouth daily., Disp: , Rfl:    cyclobenzaprine (FLEXERIL) 10 MG tablet, Take 1 tablet (10 mg total) by mouth 2 (two) times daily as needed for muscle spasms.,  Disp: 30 tablet, Rfl: 1   ferrous sulfate 325 (65 FE) MG tablet, Take by mouth., Disp: , Rfl:    fluticasone (FLONASE SENSIMIST) 27.5 MCG/SPRAY nasal spray, 2 sprays each nostril Nasal Once Daily for 30 days, Disp: , Rfl:    hydrocortisone 2.5 % ointment, 1 application Externally Twice a day for 90 days, Disp: , Rfl:    hydrOXYzine (VISTARIL) 25 MG capsule, Take 1 capsule (25 mg total) by mouth 2 (two) times daily as needed for itching., Disp: 30 capsule, Rfl: 0   levocetirizine (XYZAL) 5 MG tablet, Take by mouth., Disp: , Rfl:    Methylcobalamin (B12-ACTIVE) 1 MG CHEW, as directed Orally, Disp: , Rfl:    Multiple Vitamin (MULTI-VITAMIN) tablet, Take by mouth., Disp: , Rfl:    Phentermine-Topiramate (QSYMIA) 3.75-23 MG  CP24, Take one capsule (3.75-23 mg) daily for 14 days, then increase to 7.5/46 mg dosing with new RX, Disp: 14 capsule, Rfl: 0   TOLAK 4 % CREA, Apply topically daily., Disp: , Rfl:    VITAMIN D PO, Take by mouth., Disp: , Rfl:    [START ON 02/05/2024] Phentermine-Topiramate (QSYMIA) 7.5-46 MG CP24, After two weeks of 3.75-23 mg capsules, start taking one capsule (7.5-46 mg) daily., Disp: 30 capsule, Rfl: 2    Objective:    BP 112/70 (BP Location: Left Arm, Patient Position: Sitting, Cuff Size: Large)   Pulse 64   Temp 97.9 F (36.6 C) (Temporal)   Ht 5\' 7"  (1.702 m)   Wt 211 lb 9.6 oz (96 kg)   SpO2 99%   BMI 33.14 kg/m   BP Readings from Last 3 Encounters:  01/29/24 112/70  11/07/23 118/75  01/25/23 110/72      Physical Exam Constitutional:      General: She is not in acute distress.    Appearance: Normal appearance. She is obese. She is not ill-appearing.  HENT:     Head: Normocephalic.     Right Ear: Tympanic membrane normal.     Left Ear: Tympanic membrane normal.     Nose: Nose normal.     Mouth/Throat:     Mouth: Mucous membranes are moist.  Eyes:     Extraocular Movements: Extraocular movements intact.     Pupils: Pupils are equal, round, and reactive to light.  Cardiovascular:     Rate and Rhythm: Normal rate and regular rhythm.  Pulmonary:     Effort: Pulmonary effort is normal.     Breath sounds: Normal breath sounds.  Abdominal:     General: Abdomen is flat. Bowel sounds are normal.     Palpations: Abdomen is soft.     Tenderness: There is no guarding or rebound.  Musculoskeletal:        General: Normal range of motion.     Cervical back: Normal range of motion.  Skin:    General: Skin is warm.     Capillary Refill: Capillary refill takes less than 2 seconds.  Neurological:     General: No focal deficit present.     Mental Status: She is alert.  Psychiatric:        Mood and Affect: Mood normal.        Behavior: Behavior normal.        Thought  Content: Thought content normal.        Judgment: Judgment normal.          Assessment & Plan:  Snoring Assessment & Plan: Sleep study ordered to r/o osa  Orders: -  Ambulatory referral to Sleep Studies  Poor sleep -     Ambulatory referral to Sleep Studies  Encounter for general adult medical examination with abnormal findings Assessment & Plan: Patient Counseling(The following topics were reviewed):  Preventative care handout given to pt  Health maintenance and immunizations reviewed. Please refer to Health maintenance section. Pt advised on safe sex, wearing seatbelts in car, and proper nutrition labwork ordered today for annual Dental health: Discussed importance of regular tooth brushing, flossing, and dental visits.   Orders: -     Lipid panel -     Basic metabolic panel -     CBC  Hyperglycemia -     Basic metabolic panel -     Qsymia; Take one capsule (3.75-23 mg) daily for 14 days, then increase to 7.5/46 mg dosing with new RX  Dispense: 14 capsule; Refill: 0 -     Qsymia; After two weeks of 3.75-23 mg capsules, start taking one capsule (7.5-46 mg) daily.  Dispense: 30 capsule; Refill: 2  Screening for lipoid disorders -     Lipid panel  Class 1 obesity due to excess calories without serious comorbidity with body mass index (BMI) of 33.0 to 33.9 in adult Assessment & Plan: Pt advised to work on diet and exercise as tolerated Trial qsymia Pdmp reviewed non opioid contract signed The beneficiary does not have any FDA labeled contraindications to the requested agent including pregnancy, lactation, h/o medullary thyroid cancer or multiple endocrine neoplasia type II.    Orders: -     Qsymia; Take one capsule (3.75-23 mg) daily for 14 days, then increase to 7.5/46 mg dosing with new RX  Dispense: 14 capsule; Refill: 0 -     Qsymia; After two weeks of 3.75-23 mg capsules, start taking one capsule (7.5-46 mg) daily.  Dispense: 30 capsule; Refill: 2  Mixed  hyperlipidemia Assessment & Plan: Ordered lipid panel, pending results. Work on low cholesterol diet and exercise as tolerated   Orders: -     Qsymia; Take one capsule (3.75-23 mg) daily for 14 days, then increase to 7.5/46 mg dosing with new RX  Dispense: 14 capsule; Refill: 0 -     Qsymia; After two weeks of 3.75-23 mg capsules, start taking one capsule (7.5-46 mg) daily.  Dispense: 30 capsule; Refill: 2  Acute non-recurrent ethmoidal sinusitis Assessment & Plan: Prescription given for augmentin 875/125 mg po bid for ten days. Pt to continue tylenol/ibuprofen prn sinus pain. Continue with humidifier prn and steam showers recommended as well. instructed If no symptom improvement in 48 hours please f/u   Orders: -     Amoxicillin-Pot Clavulanate; Take 1 tablet by mouth 2 (two) times daily.  Dispense: 20 tablet; Refill: 0      Follow-up: Return in about 3 months (around 04/30/2024) for f/u weight loss medication.   Mort Sawyers, FNP

## 2024-01-29 NOTE — Assessment & Plan Note (Signed)
 Ordered lipid panel, pending results. Work on low cholesterol diet and exercise as tolerated

## 2024-01-30 ENCOUNTER — Encounter: Payer: Self-pay | Admitting: Family

## 2024-02-27 ENCOUNTER — Encounter: Payer: Self-pay | Admitting: Family

## 2024-02-27 DIAGNOSIS — B3731 Acute candidiasis of vulva and vagina: Secondary | ICD-10-CM

## 2024-03-04 MED ORDER — FLUCONAZOLE 150 MG PO TABS: 150.0000 mg | ORAL_TABLET | Freq: Once | ORAL | 0 refills | Status: AC

## 2024-03-11 ENCOUNTER — Ambulatory Visit
Admission: RE | Admit: 2024-03-11 | Discharge: 2024-03-11 | Disposition: A | Discharge status: Home / Self Care | Source: Ambulatory Visit | Attending: Obstetrics and Gynecology | Admitting: Obstetrics and Gynecology

## 2024-03-11 DIAGNOSIS — Z1231 Encounter for screening mammogram for malignant neoplasm of breast: Secondary | ICD-10-CM | POA: Diagnosis present

## 2024-03-14 ENCOUNTER — Encounter: Payer: Self-pay | Admitting: Obstetrics and Gynecology

## 2024-04-30 ENCOUNTER — Ambulatory Visit: Admitting: Family

## 2024-04-30 ENCOUNTER — Encounter: Payer: Self-pay | Admitting: Family

## 2024-04-30 VITALS — BP 106/74 | HR 75 | Temp 98.0°F | Ht 67.0 in | Wt 197.0 lb

## 2024-04-30 DIAGNOSIS — J0110 Acute frontal sinusitis, unspecified: Secondary | ICD-10-CM | POA: Insufficient documentation

## 2024-04-30 DIAGNOSIS — E6609 Other obesity due to excess calories: Secondary | ICD-10-CM

## 2024-04-30 DIAGNOSIS — E66811 Obesity, class 1: Secondary | ICD-10-CM | POA: Diagnosis not present

## 2024-04-30 DIAGNOSIS — R002 Palpitations: Secondary | ICD-10-CM | POA: Insufficient documentation

## 2024-04-30 DIAGNOSIS — R739 Hyperglycemia, unspecified: Secondary | ICD-10-CM | POA: Diagnosis not present

## 2024-04-30 DIAGNOSIS — E782 Mixed hyperlipidemia: Secondary | ICD-10-CM

## 2024-04-30 DIAGNOSIS — Z6833 Body mass index (BMI) 33.0-33.9, adult: Secondary | ICD-10-CM

## 2024-04-30 MED ORDER — AMOXICILLIN-POT CLAVULANATE 875-125 MG PO TABS: 1.0000 | ORAL_TABLET | Freq: Two times a day (BID) | ORAL | 0 refills | Status: DC

## 2024-04-30 MED ORDER — TOPIRAMATE 50 MG PO TABS
50.0000 mg | ORAL_TABLET | Freq: Two times a day (BID) | ORAL | 1 refills | Status: DC
Start: 2024-04-30 — End: 2024-05-01

## 2024-04-30 MED ORDER — PREDNISONE 10 MG (21) PO TBPK: ORAL_TABLET | ORAL | 0 refills | Status: DC

## 2024-04-30 MED ORDER — PHENTERMINE HCL 8 MG PO TABS: 8.0000 mg | ORAL_TABLET | Freq: Every day | ORAL | 0 refills | Status: DC

## 2024-04-30 NOTE — Assessment & Plan Note (Signed)
 Suspect that this is r/t dehydration however did advise pt to increase oral hydration goal 60-80 oz a day and also to focus on if this is anxiety provoked. Did also have her note if there is anxiety that is provoking the situation at the time of symptoms. If any cp and or sob occur with the palpitations will need more urgent evaluation. Did suggest EKG today to obtain a baseline although at this time asymptomatic however pt declines for now and would like to pursue a symptom diary instead and play close attention to hydration throughout the day.

## 2024-04-30 NOTE — Addendum Note (Signed)
 Addended by: Felicita Horns on: 04/30/2024 08:58 AM   Modules accepted: Orders

## 2024-04-30 NOTE — Progress Notes (Addendum)
 Established Patient Office Visit  Subjective:      CC:  Chief Complaint  Patient presents with   Medical Management of Chronic Issues    HPI: Natalie Robinson is a 48 y.o. female presenting on 04/30/2024 for Medical Management of Chronic Issues . Obesity: on qsymia , currently at 7.5-46 mg and tolerating well. She is making better food choices because she is not having as much food noises and doesn't feel she is hungry all day as she was prior. Exercise: walking four times a week [place]. She is also planning to incorporate the gym in the near future.   She does state at the end of the day when she lies flat she can feel her heart palpitated. Otherwise doesn't feel this and is not all the time only intermittently, few times a month. There is a potential for dehydration. She states she has not had an EKG in about six years but she does state when she did have them they were NSR. No chest pain or sob.   She states she felt it before the medication.   She does think she has another sinus infection, with sinus pressure and nasal congestion. Green sputum from the nose. Going on a cruise in a week and she would like to get this under control. She is using flonase daily as well as as xyzal.   Wt Readings from Last 3 Encounters:  04/30/24 197 lb (89.4 kg)  01/29/24 211 lb 9.6 oz (96 kg)  11/07/23 215 lb (97.5 kg)       Social history:  Relevant past medical, surgical, family and social history reviewed and updated as indicated. Interim medical history since our last visit reviewed.  Allergies and medications reviewed and updated.  DATA REVIEWED: CHART IN EPIC     ROS: Negative unless specifically indicated above in HPI.    Current Outpatient Medications:    amoxicillin -clavulanate (AUGMENTIN ) 875-125 MG tablet, Take 1 tablet by mouth 2 (two) times daily., Disp: 20 tablet, Rfl: 0   Ascorbic Acid (VITAMIN C) 500 MG CAPS, Take  1,000 mg by mouth daily., Disp: , Rfl:    BIOTIN PO, Take by mouth., Disp: , Rfl:    Calcium Carbonate (CALCIUM 600 PO), Take 600 mg by mouth daily., Disp: , Rfl:    cyclobenzaprine  (FLEXERIL ) 10 MG tablet, Take 1 tablet (10 mg total) by mouth 2 (two) times daily as needed for muscle spasms., Disp: 30 tablet, Rfl: 1   ferrous sulfate 325 (65 FE) MG tablet, Take by mouth., Disp: , Rfl:    fluticasone (FLONASE SENSIMIST) 27.5 MCG/SPRAY nasal spray, 2 sprays each nostril Nasal Once Daily for 30 days, Disp: , Rfl:    hydrocortisone 2.5 % ointment, 1 application Externally Twice a day for 90 days, Disp: , Rfl:    hydrOXYzine  (VISTARIL ) 25 MG capsule, Take 1 capsule (25 mg total) by mouth 2 (two) times daily as needed for itching., Disp: 30 capsule, Rfl: 0   levocetirizine (XYZAL) 5 MG tablet, Take by mouth., Disp: , Rfl:    Methylcobalamin (B12-ACTIVE) 1 MG CHEW, as directed Orally, Disp: , Rfl:    Multiple Vitamin (MULTI-VITAMIN) tablet, Take by mouth., Disp: , Rfl:    Phentermine HCl 8 MG TABS, Take 1 tablet (8 mg total) by mouth daily., Disp: 30 tablet, Rfl: 0   predniSONE  (STERAPRED UNI-PAK 21 TAB) 10 MG (21) TBPK tablet, Take as directed, Disp: 1 each, Rfl: 0  TOLAK 4 % CREA, Apply topically daily., Disp: , Rfl:    topiramate (TOPAMAX) 50 MG tablet, Take 1 tablet (50 mg total) by mouth 2 (two) times daily., Disp: 90 tablet, Rfl: 1   VITAMIN D  PO, Take by mouth., Disp: , Rfl:       Objective:     BP 106/74 (BP Location: Left Arm, Patient Position: Sitting, Cuff Size: Normal)   Pulse 75   Temp 98 F (36.7 C) (Temporal)   Ht 5\' 7"  (1.702 m)   Wt 197 lb (89.4 kg)   SpO2 98%   BMI 30.85 kg/m   Wt Readings from Last 3 Encounters:  04/30/24 197 lb (89.4 kg)  01/29/24 211 lb 9.6 oz (96 kg)  11/07/23 215 lb (97.5 kg)    Physical Exam Vitals reviewed.  Constitutional:      General: She is not in acute distress.    Appearance: Normal appearance. She is normal weight. She is not  ill-appearing, toxic-appearing or diaphoretic.  HENT:     Head: Normocephalic.     Right Ear: Tympanic membrane normal.     Left Ear: Tympanic membrane normal.     Nose: Nose normal.     Mouth/Throat:     Mouth: Mucous membranes are dry.     Pharynx: No oropharyngeal exudate or posterior oropharyngeal erythema.  Eyes:     Extraocular Movements: Extraocular movements intact.     Pupils: Pupils are equal, round, and reactive to light.  Cardiovascular:     Rate and Rhythm: Normal rate and regular rhythm.     Pulses: Normal pulses.     Heart sounds: Normal heart sounds.  Pulmonary:     Effort: Pulmonary effort is normal.     Breath sounds: Normal breath sounds.  Musculoskeletal:        General: Normal range of motion.     Cervical back: Normal range of motion.  Neurological:     General: No focal deficit present.     Mental Status: She is alert and oriented to person, place, and time. Mental status is at baseline.  Psychiatric:        Mood and Affect: Mood normal.        Behavior: Behavior normal.        Thought Content: Thought content normal.        Judgment: Judgment normal.           Assessment & Plan:  Palpitations Assessment & Plan: Suspect that this is r/t dehydration however did advise pt to increase oral hydration goal 60-80 oz a day and also to focus on if this is anxiety provoked. Did also have her note if there is anxiety that is provoking the situation at the time of symptoms. If any cp and or sob occur with the palpitations will need more urgent evaluation. Did suggest EKG today to obtain a baseline although at this time asymptomatic however pt declines for now and would like to pursue a symptom diary instead and play close attention to hydration throughout the day.    Acute non-recurrent frontal sinusitis Assessment & Plan: Prescription given for augmentin  875/125 mg po bid for ten days. Rx prednisone  pack take as directed Pt to continue tylenol /ibuprofen  prn  sinus pain. Continue with humidifier prn and steam showers recommended as well. instructed If no symptom improvement in 48 hours please f/u   Orders: -     Amoxicillin -Pot Clavulanate; Take 1 tablet by mouth 2 (two) times daily.  Dispense: 20  tablet; Refill: 0 -     predniSONE ; Take as directed  Dispense: 1 each; Refill: 0  Class 1 obesity due to excess calories without serious comorbidity with body mass index (BMI) of 33.0 to 33.9 in adult Assessment & Plan: Pdmp reviewed will proceed with qsymia  tolerating well  Pt advised to work on diet and exercise as tolerated   Orders: -     Phentermine HCl; Take 1 tablet (8 mg total) by mouth daily.  Dispense: 30 tablet; Refill: 0 -     Topiramate; Take 1 tablet (50 mg total) by mouth 2 (two) times daily.  Dispense: 90 tablet; Refill: 1  Hyperglycemia  Mixed hyperlipidemia     Return in about 6 months (around 10/30/2024) for f/u weight loss medication.  Felicita Horns, MSN, APRN, FNP-C [facility]

## 2024-04-30 NOTE — Assessment & Plan Note (Signed)
 Pdmp reviewed will proceed with qsymia  tolerating well  Pt advised to work on diet and exercise as tolerated

## 2024-04-30 NOTE — Assessment & Plan Note (Signed)
 Prescription given for augmentin  875/125 mg po bid for ten days. Rx prednisone  pack take as directed Pt to continue tylenol /ibuprofen  prn sinus pain. Continue with humidifier prn and steam showers recommended as well. instructed If no symptom improvement in 48 hours please f/u

## 2024-05-01 ENCOUNTER — Other Ambulatory Visit: Payer: Self-pay | Admitting: Family

## 2024-05-01 DIAGNOSIS — E6609 Other obesity due to excess calories: Secondary | ICD-10-CM

## 2024-05-01 MED ORDER — TOPIRAMATE 50 MG PO TABS: 50.0000 mg | ORAL_TABLET | Freq: Every day | ORAL | Status: DC

## 2024-06-17 ENCOUNTER — Other Ambulatory Visit: Payer: Self-pay | Admitting: Family

## 2024-06-17 DIAGNOSIS — E6609 Other obesity due to excess calories: Secondary | ICD-10-CM

## 2024-06-17 NOTE — Telephone Encounter (Signed)
 Last refill 04/30/24 #30 w/ 0 refills LOV 04/30/24 NOV 11/04/24

## 2024-07-24 ENCOUNTER — Other Ambulatory Visit: Payer: Self-pay | Admitting: Family

## 2024-07-24 DIAGNOSIS — E66811 Obesity, class 1: Secondary | ICD-10-CM

## 2024-08-22 ENCOUNTER — Other Ambulatory Visit: Payer: Self-pay | Admitting: Family

## 2024-08-22 DIAGNOSIS — E6609 Other obesity due to excess calories: Secondary | ICD-10-CM

## 2024-08-23 ENCOUNTER — Other Ambulatory Visit: Payer: Self-pay | Admitting: Family

## 2024-10-02 ENCOUNTER — Other Ambulatory Visit: Payer: Self-pay | Admitting: Family

## 2024-10-02 DIAGNOSIS — E66811 Other obesity due to excess calories: Secondary | ICD-10-CM

## 2024-10-30 ENCOUNTER — Other Ambulatory Visit: Payer: Self-pay | Admitting: Family

## 2024-10-30 DIAGNOSIS — E6609 Other obesity due to excess calories: Secondary | ICD-10-CM

## 2024-11-04 ENCOUNTER — Ambulatory Visit: Admitting: Family

## 2024-11-04 ENCOUNTER — Encounter: Payer: Self-pay | Admitting: Family

## 2024-11-04 VITALS — BP 104/68 | HR 81 | Temp 98.0°F | Ht 67.0 in | Wt 189.6 lb

## 2024-11-04 DIAGNOSIS — E66811 Obesity, class 1: Secondary | ICD-10-CM

## 2024-11-04 DIAGNOSIS — F411 Generalized anxiety disorder: Secondary | ICD-10-CM

## 2024-11-04 DIAGNOSIS — Z23 Encounter for immunization: Secondary | ICD-10-CM

## 2024-11-04 MED ORDER — HYDROXYZINE HCL 25 MG PO TABS: ORAL_TABLET | ORAL | 0 refills | Status: AC

## 2024-11-04 MED ORDER — PHENTERMINE HCL 8 MG PO TABS: 1.0000 | ORAL_TABLET | Freq: Every day | ORAL | 1 refills | Status: AC

## 2024-11-04 MED ORDER — TOPIRAMATE 50 MG PO TABS: 50.0000 mg | ORAL_TABLET | Freq: Every day | ORAL | 1 refills | Status: AC

## 2024-11-04 NOTE — Progress Notes (Unsigned)
 Established Patient Office Visit  Subjective:      CC:  Chief Complaint  Patient presents with   Medical Management of Chronic Issues    HPI: Natalie Robinson is a 48 y.o. female presenting on 11/04/2024 for Medical Management of Chronic Issues   Discussed the use of AI scribe software for clinical note transcription with the patient, who gave verbal consent to proceed.  History of Present Illness Natalie Robinson is a 48 year old female who presents with sinus pressure and anxiety-related palpitations.  She experiences sinus pressure, particularly in the mornings, described as 'pressure right here'. The pressure is not associated with significant nasal discharge, though she notes some clear to white mucus. She has been using Xyzal daily for months and Flonase as needed when she feels pressure.  She experiences palpitations associated with anxiety, occurring when she feels anxious or nervous. She has a history of using hydroxyzine , initially prescribed for hives, but was unaware it could be used for anxiety. She has not refilled it since 2022.  She has been focusing on weight loss and reports a significant change in her body composition, although the weight loss has slowed. She has lost 26 pounds since the beginning of the year, with her weight decreasing from 211 pounds in March to 189 pounds currently. Her physical activity includes walking and achieving 10,000 steps daily, often involving activities with her children.         Wt Readings from Last 3 Encounters:  11/04/24 189 lb 9.6 oz (86 kg)  04/30/24 197 lb (89.4 kg)  01/29/24 211 lb 9.6 oz (96 kg)        Social history:  Relevant past medical, surgical, family and social history reviewed and updated as indicated. Interim medical history since our last visit reviewed.  Allergies and medications reviewed and updated.  DATA REVIEWED: CHART IN EPIC     ROS: Negative unless specifically indicated above  in HPI.    Current Outpatient Medications:    amoxicillin -clavulanate (AUGMENTIN ) 875-125 MG tablet, Take 1 tablet by mouth 2 (two) times daily., Disp: 20 tablet, Rfl: 0   Ascorbic Acid (VITAMIN C) 500 MG CAPS, Take 1,000 mg by mouth daily., Disp: , Rfl:    BIOTIN PO, Take by mouth., Disp: , Rfl:    Calcium Carbonate (CALCIUM 600 PO), Take 600 mg by mouth daily., Disp: , Rfl:    cyclobenzaprine  (FLEXERIL ) 10 MG tablet, Take 1 tablet (10 mg total) by mouth 2 (two) times daily as needed for muscle spasms., Disp: 30 tablet, Rfl: 1   ferrous sulfate 325 (65 FE) MG tablet, Take by mouth., Disp: , Rfl:    fluticasone (FLONASE SENSIMIST) 27.5 MCG/SPRAY nasal spray, 2 sprays each nostril Nasal Once Daily for 30 days, Disp: , Rfl:    hydrocortisone 2.5 % ointment, 1 application Externally Twice a day for 90 days, Disp: , Rfl:    hydrOXYzine  (ATARAX ) 25 MG tablet, Take 1/2 to one tablet po tid prn anxiety, Disp: 30 tablet, Rfl: 0   levocetirizine (XYZAL) 5 MG tablet, Take by mouth., Disp: , Rfl:    Methylcobalamin (B12-ACTIVE) 1 MG CHEW, as directed Orally, Disp: , Rfl:    Multiple Vitamin (MULTI-VITAMIN) tablet, Take by mouth., Disp: , Rfl:    Phentermine  HCl (LOMAIRA ) 8 MG TABS, TAKE 1 TABLET BY MOUTH EVERY DAY, Disp: 30 tablet, Rfl: 2   TOLAK 4 % CREA, Apply topically daily., Disp: , Rfl:    topiramate  (TOPAMAX ) 50 MG tablet, TAKE 1  TABLET BY MOUTH TWICE A DAY, Disp: 60 tablet, Rfl: 2   VITAMIN D  PO, Take by mouth., Disp: , Rfl:         Objective:        BP 104/68 (BP Location: Left Arm, Patient Position: Sitting, Cuff Size: Normal)   Pulse 81   Temp 98 F (36.7 C) (Temporal)   Ht 5' 7 (1.702 m)   Wt 189 lb 9.6 oz (86 kg)   SpO2 97%   BMI 29.70 kg/m   Physical Exam MEASUREMENTS: Weight- 189. HEENT: Tenderness over sinuses on palpation.  Wt Readings from Last 3 Encounters:  11/04/24 189 lb 9.6 oz (86 kg)  04/30/24 197 lb (89.4 kg)  01/29/24 211 lb 9.6 oz (96 kg)    Physical  Exam Vitals reviewed.  Constitutional:      General: She is not in acute distress.    Appearance: Normal appearance. She is normal weight. She is not ill-appearing, toxic-appearing or diaphoretic.  HENT:     Head: Normocephalic.     Nose: No congestion.     Right Turbinates: Not swollen.     Left Turbinates: Not swollen.     Right Sinus: Maxillary sinus tenderness present.     Left Sinus: Maxillary sinus tenderness present.  Cardiovascular:     Rate and Rhythm: Normal rate.  Pulmonary:     Effort: Pulmonary effort is normal.  Musculoskeletal:        General: Normal range of motion.  Neurological:     General: No focal deficit present.     Mental Status: She is alert and oriented to person, place, and time. Mental status is at baseline.  Psychiatric:        Mood and Affect: Mood normal.        Behavior: Behavior normal.        Thought Content: Thought content normal.        Judgment: Judgment normal.          Results LABS Cholesterol: hypercholesterolemia (01/2024) Glucose: mild hyperglycemia (01/2024)  Assessment & Plan:   Assessment and Plan Assessment & Plan Adult Wellness Visit Weight loss progress noted with current weight at 189 lbs, down from 211 lbs in March. Exercise includes walking and stair climbing. Labs from March showed mildly elevated glucose and cholesterol, but no anemia or thyroid issues. - Continue current weight management plan. - Encouraged regular exercise, including walking and stair climbing. - Will monitor weight and lab results at annual visit.  Class 1 obesity due to excess calories Weight loss progress with current weight at 189 lbs, down from 211 lbs in March. Weight loss has slowed but body composition is improving. Exercise includes walking and stair climbing. - Continue current weight management plan. - Encouraged regular exercise, including walking and stair climbing.  Mixed hyperlipidemia Cholesterol levels were slightly elevated  in March. No immediate changes in management as labs are not due for repeat until annual visit. - Will monitor cholesterol levels at annual visit.  Hyperglycemia Glucose levels were mildly elevated in March. No immediate changes in management as labs are not due for repeat until annual visit. - Continue current management plan.  Generalized anxiety disorder Palpitations likely related to anxiety rather than dehydration. Hydroxyzine  prescribed for anxiety but not used for this purpose. Discussed using hydroxyzine  during periods of heightened anxiety. Non-pharmacological techniques for managing anxiety discussed, including distraction techniques and physical activity. - Use hydroxyzine  during periods of heightened anxiety, starting with half a tablet to  assess for drowsiness. - Implement non-pharmacological techniques for managing anxiety, such as distraction techniques and physical activity.  Acute ethmoidal sinusitis and allergic rhinitis Intermittent sinus pressure, possibly related to seasonal changes. Current use of Xyzal and Flonase as needed. Consideration of switching to Zyrtec for better efficacy. Prednisone  pack available at home for potential use if symptoms worsen. - Switch to Zyrtec for allergic rhinitis. - Use Flonase regularly during periods of sinus pressure. - Use prednisone  pack if symptoms worsen or if there is no improvement.        Return in about 6 months (around 05/05/2025) for f/u CPE.     Ginger Patrick, MSN, APRN, FNP-C [facility]

## 2024-11-18 ENCOUNTER — Encounter: Payer: Self-pay | Admitting: Family

## 2024-11-18 DIAGNOSIS — J0110 Acute frontal sinusitis, unspecified: Secondary | ICD-10-CM

## 2024-11-19 MED ORDER — AMOXICILLIN-POT CLAVULANATE 875-125 MG PO TABS: 1.0000 | ORAL_TABLET | Freq: Two times a day (BID) | ORAL | 0 refills | Status: AC

## 2024-11-26 NOTE — Progress Notes (Unsigned)
 "  Gynecology Annual Exam  PCP: Corwin Antu, FNP  Chief Complaint: No chief complaint on file.   History of Present Illness: Patient is a 48 y.o. H7E8896 presents for annual exam. The patient has no complaints today.   LMP: No LMP recorded. Patient has had an ablation. Average Interval: {Desc; regular/irreg:14544}, {numbers 22-35:14824} days Duration of flow: {numbers; 0-10:33138} days Heavy Menses: {yes/no:63} Clots: {yes/no:63} Intermenstrual Bleeding: {yes/no:63} Postcoital Bleeding: {yes/no:63} Dysmenorrhea: {yes/no:63}   The patient {sys sexually active:13135} sexually active. She currently uses tubal ligation for contraception. She {has/denies:315300} dyspareunia.  The patient {DOES_DOES WNU:81435} perform self breast exams.  There {is/is no:19420} notable family history of breast or ovarian cancer in her family.  The patient wears seatbelts: {yes/no:63}.   The patient has regular exercise: {yes/no/not asked:9010}.    The patient {Blank single:19197::reports,denies} current symptoms of depression.    Review of Systems: ROS  Past Medical History:  Patient Active Problem List   Diagnosis Date Noted Date Diagnosed   Palpitations 04/30/2024    Snoring 01/29/2024    Chronic urticaria 01/25/2023    Iron deficiency anemia due to chronic blood loss 01/25/2023    Vitamin D  deficiency 01/25/2023    History of ankle fracture 01/25/2023    Mixed hyperlipidemia 01/25/2023    Class 1 obesity due to excess calories without serious comorbidity with body mass index (BMI) of 33.0 to 33.9 in adult 01/25/2023    Dysmenorrhea 10/27/2022    Thyroid nodule 10/21/2019     2014 had a nuclear screen that was normal. Had been doing annual US , but now to every 5 years. Per imaging 2019 - US  head/neck - 5 mm nodule - recommend following if >1 cm.     History of squamous cell carcinoma 10/21/2019    Migraine 10/21/2019     Associated with cycle. Managed with Advil  primarily     Past  Surgical History:  Past Surgical History:  Procedure Laterality Date   ABLATION  2017   ANKLE SURGERY  09/01/2023   left   CESAREAN SECTION  12/07/12,08/11/15   COLONOSCOPY WITH PROPOFOL  N/A 01/25/2021   Procedure: COLONOSCOPY WITH PROPOFOL ;  Surgeon: Unk Corinn Skiff, MD;  Location: [facility] END[facility]COPY;  Service: Gastroenterology;  Laterality: N/A;   NASAL SINUS SURGERY     SKIN CANCER EXCISION     under nose   TUBAL LIGATION  2016    Gynecologic History:  No LMP recorded. Patient has had an ablation.  Last Pap: Results were:  NIL and HR HPV negative  Last mammogram: 03/11/24 Results were: DONNA I  Obstetric History: [id number]  Family History:  Family History  Problem Relation Age of Onset   Hyperlipidemia Mother    Hyperlipidemia Father    Arthritis Father    Skin cancer Father    Mental illness Sister        anxiety depression   Heart disease Maternal Grandmother    Hyperlipidemia Maternal Grandfather    Stroke Paternal Grandmother    Prostate cancer Maternal Uncle 60   Prostate cancer Maternal Uncle 53   Breast cancer Neg Hx     Social History:  Social History   Socioeconomic History   Marital status: Married    Spouse name: Merchant Navy Officer   Number of children: 3   Years of education: College   Highest education level: Not on file  Occupational History   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  Tobacco Use   Smoking status: Former  Current packs/day: 0.00    Average packs/day: 0.1 packs/day for 10.0 years (1.0 ttl pk-yrs)    Types: Cigarettes    Start date: 11/28/1993    Quit date: 11/29/2003    Years since quitting: 21.0   Smokeless tobacco: Never  Vaping Use   Vaping status: Never Used  Substance and Sexual Activity   Alcohol use: Yes    Comment: 1x per week, 1 serving   Drug use: Never   Sexual activity: Yes    Partners: Male    Birth control/protection: Surgical    Comment: Tubal Ligation  Other Topics Concern   Not on file  Social  History Narrative   10/21/19   From: [place]    Living: with Husband Government Social Research Officer), and 3 children   Work: has a dispensing optician      Family: twins - Aiden and Paediatric Nurse (2014) and Omega (2015)      Enjoys: spend time with family, reading       Exercise: burn [employer] - 3 times a week   Diet: trying to manage calories - low carb - bar for breakfast/shake for lunch, regular meal for dinner      Safety   Seat belts: Yes    Guns: Yes  and secure   Safe in relationships: Yes    Social Drivers of Health   Tobacco Use: Medium Risk (11/04/2024)   Patient History    Smoking Tobacco Use: Former    Smokeless Tobacco Use: Never    Passive Exposure: Not on Actuary Strain: Not on file  Food Insecurity: Not on file  Transportation Needs: Not on file  Physical Activity: Not on file  Stress: Not on file  Social Connections: Not on file  Intimate Partner Violence: Not on file  Depression (PHQ2-9): Low Risk (11/04/2024)   Depression (PHQ2-9)    PHQ-2 Score: 0  Alcohol Screen: Not on file  Housing: Not on file  Utilities: Not on file  Health Literacy: Not on file    Allergies:  Allergies[1]  Medications: Prior to Admission medications  Medication Sig Start Date End Date Taking? Authorizing Provider  amoxicillin -clavulanate (AUGMENTIN ) 875-125 MG tablet Take 1 tablet by mouth 2 (two) times daily. 11/19/24   Dugal, Tabitha, FNP  Ascorbic Acid (VITAMIN C) 500 MG CAPS Take 1,000 mg by mouth daily.    [provider]  BIOTIN PO Take by mouth.    [provider]  Calcium Carbonate (CALCIUM 600 PO) Take 600 mg by mouth daily.    [provider]  cyclobenzaprine  (FLEXERIL ) 10 MG tablet Take 1 tablet (10 mg total) by mouth 2 (two) times daily as needed for muscle spasms. 11/07/23   Copland, Alicia B, PA-C  ferrous sulfate 325 (65 FE) MG tablet Take by mouth.    [provider]  fluticasone (FLONASE SENSIMIST) 27.5 MCG/SPRAY nasal spray 2  sprays each nostril Nasal Once Daily for 30 days    [provider]  hydrocortisone 2.5 % ointment 1 application Externally Twice a day for 90 days    [provider]  hydrOXYzine  (ATARAX ) 25 MG tablet Take 1/2 to one tablet po tid prn anxiety 11/04/24   Dugal, Tabitha, FNP  levocetirizine (XYZAL) 5 MG tablet Take by mouth.    [provider]  Methylcobalamin (B12-ACTIVE) 1 MG CHEW as directed Orally    [provider]  Multiple Vitamin (MULTI-VITAMIN) tablet Take by mouth.    [provider]  Phentermine  HCl (  LOMAIRA ) 8 MG TABS Take 1 tablet (8 mg total) by mouth daily. 11/04/24   Corwin Antu, FNP  TOLAK 4 % CREA Apply topically daily. 10/23/23   [provider]  topiramate  (TOPAMAX ) 50 MG tablet Take 1 tablet (50 mg total) by mouth daily. 11/04/24   Corwin Antu, FNP  VITAMIN D  PO Take by mouth.    [provider]    Physical Exam Vitals: There were no vitals taken for this visit.  General: NAD HEENT: normocephalic, anicteric Thyroid: no enlargement, no palpable nodules Pulmonary: No increased work of breathing, CTAB Cardiovascular: RRR, distal pulses 2+ Breast: Breast symmetrical, no tenderness, no palpable nodules or masses, no skin or nipple retraction present, no nipple discharge.  No axillary or supraclavicular lymphadenopathy. Abdomen: NABS, soft, non-tender, non-distended.  Umbilicus without lesions.  No hepatomegaly, splenomegaly or masses palpable. No evidence of hernia  Genitourinary:  External: Normal external female genitalia.  Normal urethral meatus, normal Bartholin's and Skene's glands.    Vagina: Normal vaginal mucosa, no evidence of prolapse.    Cervix: Grossly normal in appearance, no bleeding  Uterus: Non-enlarged, mobile, normal contour.  No CMT  Adnexa: ovaries non-enlarged, no adnexal masses  Rectal: deferred  Lymphatic: no evidence of inguinal lymphadenopathy Extremities: no edema, erythema, or  tenderness Neurologic: Grossly intact Psychiatric: mood appropriate, affect full   Assessment: 48 y.o. [id number] routine annual exam  Plan: Problem List Items Addressed This Visit   None Visit Diagnoses       Well woman exam    -  Primary       1) Mammogram - recommend yearly screening mammogram.  Mammogram {Blank single:19197::Is up to date,Was ordered today}   2) STI screening  {Blank single:19197::was,was not}offered and {Blank single:19197::accepted,declined,therefore not obtained}  3) ASCCP guidelines and rational discussed.  Patient opts for {Blank single:19197::***,every 5 years,every 3 years,yearly,discontinue age >65,discontinue secondary to prior hysterectomy} screening interval  4) Contraception - the patient is currently using  {method:5051}.  She is {Blank single:19197::happy with her current form of contraception and plans to continue,interested in changing to ***,interested in starting Contraception: ***,not currently in need of contraception secondary to being sterile,attempting to conceive in the near future}  5) Colonoscopy -- Screening recommended starting at age 39 for average risk individuals, age 27 for individuals deemed at increased risk (including African Americans) and recommended to continue until age 31.  For patient age 67-85 individualized approach is recommended.  Gold standard screening is via colonoscopy, Cologuard screening is an acceptable alternative for patient unwilling or unable to undergo colonoscopy.  Colorectal cancer screening for average?risk adults: 2018 guideline update from the American Cancer SocietyCA: A Cancer Journal for Clinicians: Apr 26, 2017   6) Routine healthcare maintenance including cholesterol, diabetes screening discussed {Blank single:19197::managed by PCP,Ordered today,To return fasting at a later date,Declines}  7) No follow-ups on file.   Slater Rains, CNM [place]  [facility] 11/26/2024 3:14 PM             [1] No Known Allergies  "

## 2024-11-26 NOTE — Patient Instructions (Signed)
 Preventive Care 48-48 Years Old, Female Preventive care refers to lifestyle choices and visits with your health care provider that can promote health and wellness. Preventive care visits are also called wellness exams. What can I expect for my preventive care visit? Counseling Your health care provider may ask you questions about your: Medical history, including: Past medical problems. Family medical history. Pregnancy history. Current health, including: Menstrual cycle. Method of birth control. Emotional well-being. Home life and relationship well-being. Sexual activity and sexual health. Lifestyle, including: Alcohol, nicotine or tobacco, and drug use. Access to firearms. Diet, exercise, and sleep habits. Work and work Astronomer. Sunscreen use. Safety issues such as seatbelt and bike helmet use. Physical exam Your health care provider will check your: Height and weight. These may be used to calculate your BMI (body mass index). BMI is a measurement that tells if you are at a healthy weight. Waist circumference. This measures the distance around your waistline. This measurement also tells if you are at a healthy weight and may help predict your risk of certain diseases, such as type 2 diabetes and high blood pressure. Heart rate and blood pressure. Body temperature. Skin for abnormal spots. What immunizations do I need?  Vaccines are usually given at various ages, according to a schedule. Your health care provider will recommend vaccines for you based on your age, medical history, and lifestyle or other factors, such as travel or where you work. What tests do I need? Screening Your health care provider may recommend screening tests for certain conditions. This may include: Lipid and cholesterol levels. Diabetes screening. This is done by checking your blood sugar (glucose) after you have not eaten for a while (fasting). Pelvic exam and Pap test. Hepatitis B test. Hepatitis C  test. HIV (human immunodeficiency virus) test. STI (sexually transmitted infection) testing, if you are at risk. Lung cancer screening. Colorectal cancer screening. Mammogram. Talk with your health care provider about when you should start having regular mammograms. This may depend on whether you have a family history of breast cancer. BRCA-related cancer screening. This may be done if you have a family history of breast, ovarian, tubal, or peritoneal cancers. Bone density scan. This is done to screen for osteoporosis. Talk with your health care provider about your test results, treatment options, and if necessary, the need for more tests. Follow these instructions at home: Eating and drinking  Eat a diet that includes fresh fruits and vegetables, whole grains, lean protein, and low-fat dairy products. Take vitamin and mineral supplements as recommended by your health care provider. Do not drink alcohol if: Your health care provider tells you not to drink. You are pregnant, may be pregnant, or are planning to become pregnant. If you drink alcohol: Limit how much you have to 0-1 drink a day. Know how much alcohol is in your drink. In the [place] one drink equals one 12 oz bottle of beer (355 mL), one 5 oz glass of wine (148 mL), or one 1 oz glass of hard liquor (44 mL). Lifestyle Brush your teeth every morning and night with fluoride toothpaste. Floss one time each day. Exercise for at least 30 minutes 5 or more days each week. Do not use any products that contain nicotine or tobacco. These products include cigarettes, chewing tobacco, and vaping devices, such as e-cigarettes. If you need help quitting, ask your health care provider. Do not use drugs. If you are sexually active, practice safe sex. Use a condom or other form of protection to  prevent STIs. If you do not wish to become pregnant, use a form of birth control. If you plan to become pregnant, see your health care provider for a  prepregnancy visit. Take aspirin only as told by your health care provider. Make sure that you understand how much to take and what form to take. Work with your health care provider to find out whether it is safe and beneficial for you to take aspirin daily. Find healthy ways to manage stress, such as: Meditation, yoga, or listening to music. Journaling. Talking to a trusted person. Spending time with friends and family. Minimize exposure to UV radiation to reduce your risk of skin cancer. Safety Always wear your seat belt while driving or riding in a vehicle. Do not drive: If you have been drinking alcohol. Do not ride with someone who has been drinking. When you are tired or distracted. While texting. If you have been using any mind-altering substances or drugs. Wear a helmet and other protective equipment during sports activities. If you have firearms in your house, make sure you follow all gun safety procedures. Seek help if you have been physically or sexually abused. What's next? Visit your health care provider once a year for an annual wellness visit. Ask your health care provider how often you should have your eyes and teeth checked. Stay up to date on all vaccines. This information is not intended to replace advice given to you by your health care provider. Make sure you discuss any questions you have with your health care provider. Document Revised: 05/12/2021 Document Reviewed: 05/12/2021 Elsevier Patient Education  2024 Elsevier Inc. How to Do a Breast Self-Exam Doing breast self-exams can help you stay healthy. They're one way to know what's normal for your breasts. They can help you catch a problem while it's still small and can be treated. You need to: Check your breasts often. Tell your doctor about any changes. You should do breast self-exams even if you have breast implants. What you need: A mirror. A well-lit room. A pillow or other soft object. How to do a  breast self-exam Look for changes  Take off all the clothes above your waist. Stand in front of a mirror in a room with good lighting. Put your hands down at your sides. Compare your breasts in the mirror. Look for difference between them, such as: Differences in shape. Differences in size. Wrinkles, dips, and bumps in one breast and not the other. Look at each breast for skin changes, such as: Redness. Scaly spots. Spots where your skin is thicker. Dimpling. Open sores. Look for changes in your nipples, such as: Fluid coming out of a nipple. Fluid around a nipple. Bleeding. Dimpling. Redness. A nipple that looks pushed in or that has changed position. Feel for changes Lie on your back. Feel each breast. To do this: Pick a breast to feel. Place a pillow under the shoulder closest to that breast. Put the arm closest to that breast behind your head. Feel the breast using the hand of your other arm. Use the pads of your three middle fingers to make small circles starting near the nipple. Use light, medium, and firm pressure. Keep making circles, moving down over the breast. Stop when you feel your ribs. Start making circles with your fingers again, this time going up until you reach your collarbone. Then, make circles out across your breast and into your armpit area. Squeeze your nipple. Check for fluid and lumps. Do these steps again  to check your other breast. Sit or stand in the tub or shower. With soapy water on your skin, feel each breast the same way you did when you were lying down. Write down what you find Writing down what you find can help you keep track of what you want to tell your doctor. Write down: What's normal for each breast. Any changes you find. Write down: The kind of change. If your breast feels tender or painful. Any lump you find. Write down its size and where it is. When you last had your period. General tips If you're breastfeeding, the best time  to check your breasts is after you feed your baby or after you use a breast pump. If you get a period, the best time to check your breasts is 5-7 days after your period ends. With time, you'll get more used to doing the self-exam. You'll also start to know if there are changes in your breasts. Contact a doctor if: You see a change in the shape or size of your breasts or nipples. You see a change in the skin of your breast or nipples. You have fluid coming from your nipples that isn't normal. You find a new lump or thick area. You have breast pain. You have any concerns about your breast health. This information is not intended to replace advice given to you by your health care provider. Make sure you discuss any questions you have with your health care provider. Document Revised: 01/24/2024 Document Reviewed: 01/24/2024 Elsevier Patient Education  2025 [employer].

## 2024-11-27 ENCOUNTER — Encounter: Payer: Self-pay | Admitting: Advanced Practice Midwife

## 2024-11-27 ENCOUNTER — Ambulatory Visit (INDEPENDENT_AMBULATORY_CARE_PROVIDER_SITE_OTHER): Admitting: Advanced Practice Midwife

## 2024-11-27 VITALS — BP 99/66 | HR 71 | Ht 67.0 in | Wt 192.2 lb

## 2024-11-27 DIAGNOSIS — Z1321 Encounter for screening for nutritional disorder: Secondary | ICD-10-CM

## 2024-11-27 DIAGNOSIS — Z01419 Encounter for gynecological examination (general) (routine) without abnormal findings: Secondary | ICD-10-CM

## 2024-11-27 DIAGNOSIS — Z1331 Encounter for screening for depression: Secondary | ICD-10-CM | POA: Diagnosis not present

## 2024-11-27 DIAGNOSIS — Z8639 Personal history of other endocrine, nutritional and metabolic disease: Secondary | ICD-10-CM

## 2024-11-27 DIAGNOSIS — N946 Dysmenorrhea, unspecified: Secondary | ICD-10-CM

## 2024-11-27 DIAGNOSIS — Z13220 Encounter for screening for lipoid disorders: Secondary | ICD-10-CM

## 2024-11-27 DIAGNOSIS — Z131 Encounter for screening for diabetes mellitus: Secondary | ICD-10-CM

## 2024-11-27 DIAGNOSIS — Z1239 Encounter for other screening for malignant neoplasm of breast: Secondary | ICD-10-CM

## 2024-11-27 MED ORDER — CYCLOBENZAPRINE HCL 10 MG PO TABS: 10.0000 mg | ORAL_TABLET | Freq: Two times a day (BID) | ORAL | 1 refills | Status: AC | PRN

## 2024-11-28 LAB — COMPREHENSIVE METABOLIC PANEL WITH GFR
ALT: 22 IU/L (ref 0–32)
AST: 18 IU/L (ref 0–40)
Albumin: 4.6 g/dL (ref 3.9–4.9)
Alkaline Phosphatase: 71 IU/L (ref 41–116)
BUN/Creatinine Ratio: 23 (ref 9–23)
BUN: 16 mg/dL (ref 6–24)
Bilirubin Total: 0.3 mg/dL (ref 0.0–1.2)
CO2: 23 mmol/L (ref 20–29)
Calcium: 9.6 mg/dL (ref 8.7–10.2)
Chloride: 105 mmol/L (ref 96–106)
Creatinine, Ser: 0.71 mg/dL (ref 0.57–1.00)
Globulin, Total: 2.1 g/dL (ref 1.5–4.5)
Glucose: 91 mg/dL (ref 70–99)
Potassium: 4.5 mmol/L (ref 3.5–5.2)
Sodium: 141 mmol/L (ref 134–144)
Total Protein: 6.7 g/dL (ref 6.0–8.5)
eGFR: 105 mL/min/1.73

## 2024-11-28 LAB — THYROID PANEL WITH TSH
Free Thyroxine Index: 2 (ref 1.2–4.9)
T3 Uptake Ratio: 26 % (ref 24–39)
T4, Total: 7.6 ug/dL (ref 4.5–12.0)
TSH: 2.71 u[IU]/mL (ref 0.450–4.500)

## 2024-11-28 LAB — CBC WITH DIFFERENTIAL/PLATELET
Basophils Absolute: 0.1 x10E3/uL (ref 0.0–0.2)
Basos: 1 %
EOS (ABSOLUTE): 0.1 x10E3/uL (ref 0.0–0.4)
Eos: 2 %
Hematocrit: 43.3 % (ref 34.0–46.6)
Hemoglobin: 14.5 g/dL (ref 11.1–15.9)
Immature Grans (Abs): 0 x10E3/uL (ref 0.0–0.1)
Immature Granulocytes: 0 %
Lymphocytes Absolute: 2.2 x10E3/uL (ref 0.7–3.1)
Lymphs: 34 %
MCH: 32.4 pg (ref 26.6–33.0)
MCHC: 33.5 g/dL (ref 31.5–35.7)
MCV: 97 fL (ref 79–97)
Monocytes Absolute: 0.5 x10E3/uL (ref 0.1–0.9)
Monocytes: 8 %
Neutrophils Absolute: 3.6 x10E3/uL (ref 1.4–7.0)
Neutrophils: 54 %
Platelets: 347 x10E3/uL (ref 150–450)
RBC: 4.47 x10E6/uL (ref 3.77–5.28)
RDW: 12.1 % (ref 11.7–15.4)
WBC: 6.5 x10E3/uL (ref 3.4–10.8)

## 2024-11-28 LAB — LIPID PANEL WITH LDL/HDL RATIO
Cholesterol, Total: 232 mg/dL — ABNORMAL HIGH (ref 100–199)
HDL: 49 mg/dL
LDL Chol Calc (NIH): 150 mg/dL — ABNORMAL HIGH (ref 0–99)
LDL/HDL Ratio: 3.1 ratio (ref 0.0–3.2)
Triglycerides: 182 mg/dL — ABNORMAL HIGH (ref 0–149)
VLDL Cholesterol Cal: 33 mg/dL (ref 5–40)

## 2024-11-28 LAB — VITAMIN D 25 HYDROXY (VIT D DEFICIENCY, FRACTURES): Vit D, 25-Hydroxy: 66.9 ng/mL (ref 30.0–100.0)

## 2024-11-28 LAB — HGB A1C W/O EAG: Hgb A1c MFr Bld: 5.5 % (ref 4.8–5.6)

## 2024-12-05 ENCOUNTER — Ambulatory Visit: Payer: Self-pay | Admitting: Advanced Practice Midwife

## 2025-02-19 ENCOUNTER — Encounter: Admitting: Family
